# Patient Record
Sex: Male | Born: 1990 | Race: White | Hispanic: No | Marital: Single | Smoking: Never smoker
Health system: Southern US, Community
[De-identification: ages and names within clinical notes are randomized; demographics above are authoritative.]

## PROBLEM LIST (undated history)

## (undated) DIAGNOSIS — F1910 Other psychoactive substance abuse, uncomplicated: Secondary | ICD-10-CM

## (undated) DIAGNOSIS — Z8659 Personal history of other mental and behavioral disorders: Secondary | ICD-10-CM

## (undated) DIAGNOSIS — J45909 Unspecified asthma, uncomplicated: Secondary | ICD-10-CM

## (undated) DIAGNOSIS — S022XXA Fracture of nasal bones, initial encounter for closed fracture: Secondary | ICD-10-CM

## (undated) DIAGNOSIS — F1020 Alcohol dependence, uncomplicated: Secondary | ICD-10-CM

## (undated) HISTORY — DX: Fracture of nasal bones, initial encounter for closed fracture: S02.2XXA

## (undated) HISTORY — DX: Other psychoactive substance abuse, uncomplicated: F19.10

## (undated) HISTORY — PX: FRACTURE SURGERY: SHX138

## (undated) HISTORY — DX: Alcohol dependence, uncomplicated: F10.20

## (undated) HISTORY — DX: Personal history of other mental and behavioral disorders: Z86.59

## (undated) HISTORY — DX: Unspecified asthma, uncomplicated: J45.909

---
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
## 2011-09-07 ENCOUNTER — Other Ambulatory Visit: Payer: Self-pay | Admitting: Family Medicine

## 2011-09-07 DIAGNOSIS — R410 Disorientation, unspecified: Secondary | ICD-10-CM

## 2011-09-10 ENCOUNTER — Ambulatory Visit
Admission: RE | Admit: 2011-09-10 | Discharge: 2011-09-10 | Disposition: A | Discharge status: Home / Self Care | Payer: 59 | Source: Ambulatory Visit | Attending: Family Medicine | Admitting: Family Medicine

## 2011-09-10 DIAGNOSIS — R410 Disorientation, unspecified: Secondary | ICD-10-CM

## 2013-01-22 ENCOUNTER — Emergency Department (HOSPITAL_COMMUNITY): Payer: 59

## 2013-01-22 ENCOUNTER — Emergency Department (HOSPITAL_COMMUNITY)
Admission: EM | Admit: 2013-01-22 | Discharge: 2013-01-22 | Disposition: A | Discharge status: Home / Self Care | Payer: 59 | Attending: Emergency Medicine | Admitting: Emergency Medicine

## 2013-01-22 DIAGNOSIS — Z043 Encounter for examination and observation following other accident: Secondary | ICD-10-CM | POA: Insufficient documentation

## 2013-01-22 DIAGNOSIS — Y92410 Unspecified street and highway as the place of occurrence of the external cause: Secondary | ICD-10-CM | POA: Insufficient documentation

## 2013-01-22 DIAGNOSIS — Y9389 Activity, other specified: Secondary | ICD-10-CM | POA: Insufficient documentation

## 2013-01-22 DIAGNOSIS — F10929 Alcohol use, unspecified with intoxication, unspecified: Secondary | ICD-10-CM

## 2013-01-22 DIAGNOSIS — F1010 Alcohol abuse, uncomplicated: Secondary | ICD-10-CM | POA: Insufficient documentation

## 2013-01-22 LAB — URINALYSIS, ROUTINE W REFLEX MICROSCOPIC
Bilirubin Urine: NEGATIVE
Ketones, ur: NEGATIVE mg/dL
Nitrite: NEGATIVE
Protein, ur: NEGATIVE mg/dL
Specific Gravity, Urine: 1.02 (ref 1.005–1.030)
Urobilinogen, UA: 0.2 mg/dL (ref 0.0–1.0)

## 2013-01-22 LAB — RAPID URINE DRUG SCREEN, HOSP PERFORMED
Amphetamines: NOT DETECTED
Benzodiazepines: POSITIVE — AB
Opiates: NOT DETECTED

## 2013-01-22 LAB — COMPREHENSIVE METABOLIC PANEL
Albumin: 4.2 g/dL (ref 3.5–5.2)
BUN: 11 mg/dL (ref 6–23)
Creatinine, Ser: 0.79 mg/dL (ref 0.50–1.35)
GFR calc Af Amer: >90 mL/min (ref >90)
Total Protein: 6.9 g/dL (ref 6.0–8.3)

## 2013-01-22 LAB — CBC WITH DIFFERENTIAL/PLATELET
Basophils Absolute: 0 10*3/uL (ref 0.0–0.1)
Basophils Relative: 0 % (ref 0–1)
Eosinophils Absolute: 0.1 10*3/uL (ref 0.0–0.7)
Eosinophils Relative: 1 % (ref 0–5)
HCT: 43.4 % (ref 39.0–52.0)
MCHC: 36.2 g/dL — ABNORMAL HIGH (ref 30.0–36.0)
Monocytes Absolute: 0.5 10*3/uL (ref 0.1–1.0)
Neutro Abs: 3.7 10*3/uL (ref 1.7–7.7)
RDW: 12.4 % (ref 11.5–15.5)

## 2013-01-22 LAB — ETHANOL: Alcohol, Ethyl (B): 216 mg/dL — ABNORMAL HIGH (ref 0–11)

## 2013-01-22 MED ORDER — SODIUM CHLORIDE 0.9 % IV BOLUS (SEPSIS)
1000.0000 mL | Freq: Once | INTRAVENOUS | Status: AC
  Administered 2013-01-22: 1000 mL via INTRAVENOUS

## 2013-01-22 MED ORDER — IOHEXOL 300 MG/ML  SOLN
100.0000 mL | Freq: Once | INTRAMUSCULAR | Status: AC | PRN
  Administered 2013-01-22: 100 mL via INTRAVENOUS
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
## 2013-01-22 NOTE — ED Notes (Signed)
Patient vomited brown liquid all over himself and side of bed and floor
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
## 2013-01-22 NOTE — ED Provider Notes (Signed)
The patient is awake and alert now. His parents are here and are ready to take the patient home.  Patient mentioned having some mild left shoulder pain. He is able to lift off the bed but does have discomfort in the acromial clavicular region.  I ordered an x-ray of the patient's left shoulder but he decided that he did not want to wait any longer. Patient states pain is not that bad and he will followup with his doctor tomorrow.  Celene Kras, MD 01/22/13 [phone]

## 2013-01-22 NOTE — ED Notes (Signed)
Pt. Decided not to have shoulder x-ray done.  Parents also agreed to that decisionl

## 2013-03-15 ENCOUNTER — Ambulatory Visit (HOSPITAL_COMMUNITY)
Admission: RE | Admit: 2013-03-15 | Discharge: 2013-03-15 | Disposition: A | Discharge status: Home / Self Care | Payer: 59 | Source: Ambulatory Visit | Attending: Family Medicine | Admitting: Family Medicine

## 2013-03-15 ENCOUNTER — Other Ambulatory Visit (HOSPITAL_COMMUNITY): Payer: Self-pay | Admitting: Family Medicine

## 2013-03-15 DIAGNOSIS — W268XXA Contact with other sharp object(s), not elsewhere classified, initial encounter: Secondary | ICD-10-CM | POA: Insufficient documentation

## 2013-03-15 DIAGNOSIS — M79609 Pain in unspecified limb: Secondary | ICD-10-CM | POA: Insufficient documentation

## 2013-04-19 ENCOUNTER — Telehealth: Payer: Self-pay

## 2013-04-19 NOTE — Telephone Encounter (Signed)
Pt not in Epic this is work comp message will be sent in [place].

## 2013-04-19 NOTE — Telephone Encounter (Signed)
Pt wants elizabeth to call him wouldn't say why.    bf
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
## 2014-12-12 IMAGING — CR DG FOOT COMPLETE 3+V*R*
3 series · 3 of 3 positions shown · non-contrast
Comparison: None.

CLINICAL DATA: Right plantar surface foot pain inferior to the
great toe after stepping on a nail this morning.

EXAM:
RIGHT FOOT COMPLETE - 3+ VIEW

[t foot ap right]
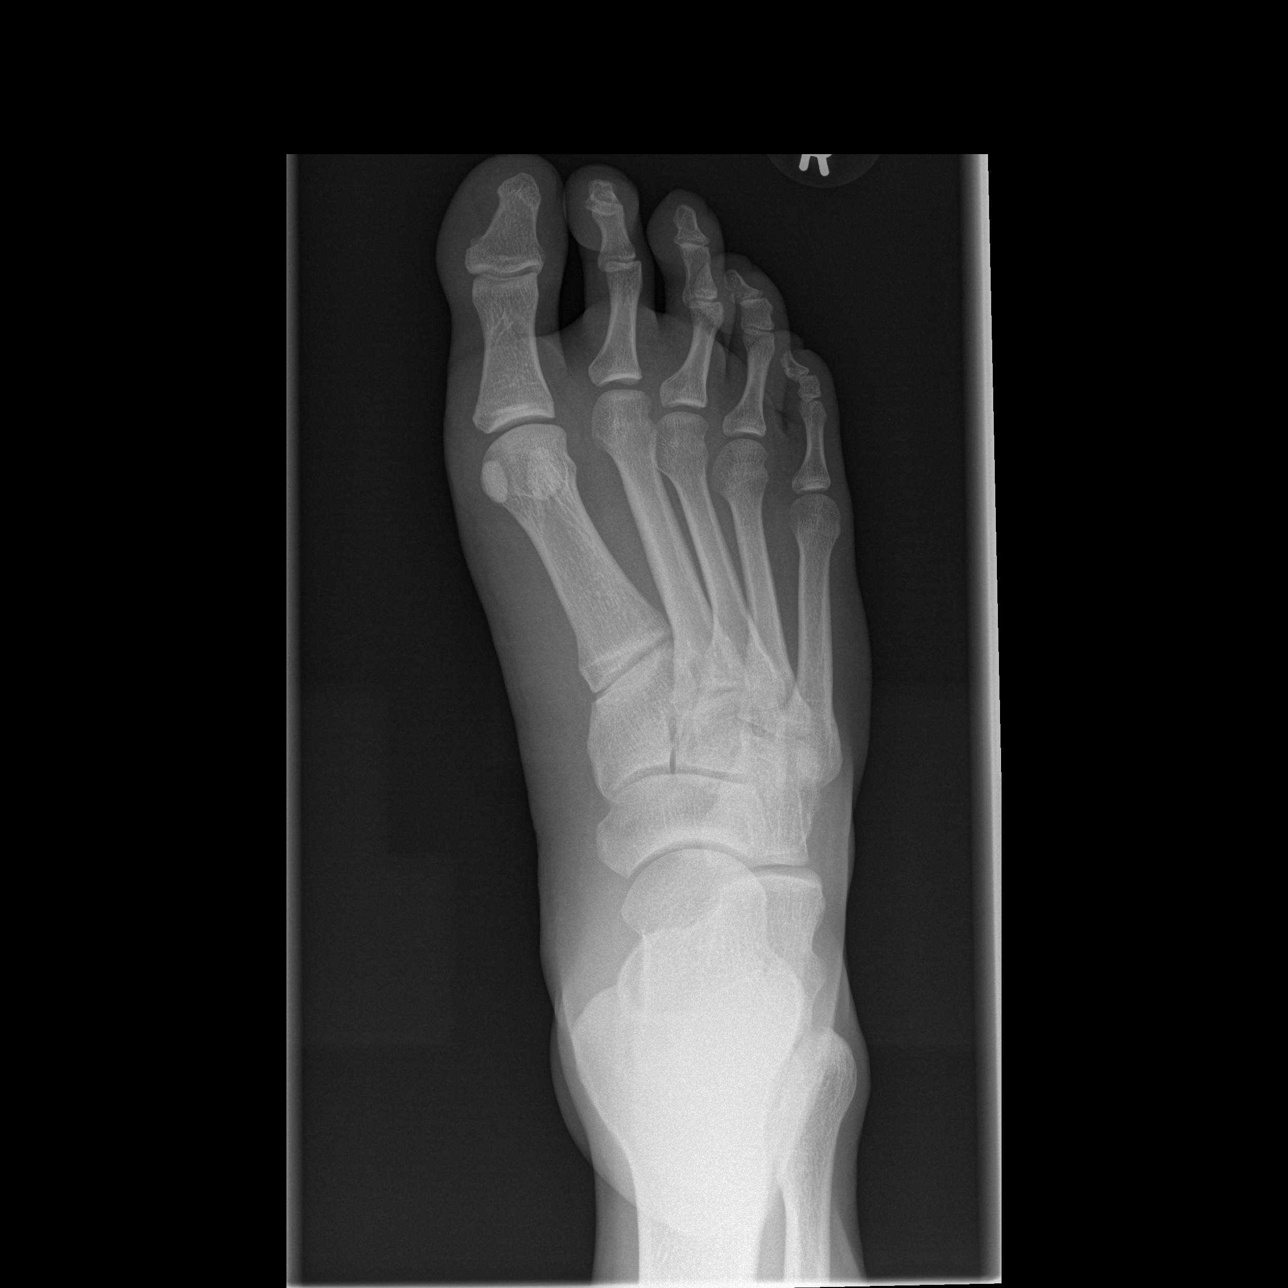

[t foot oblique right]
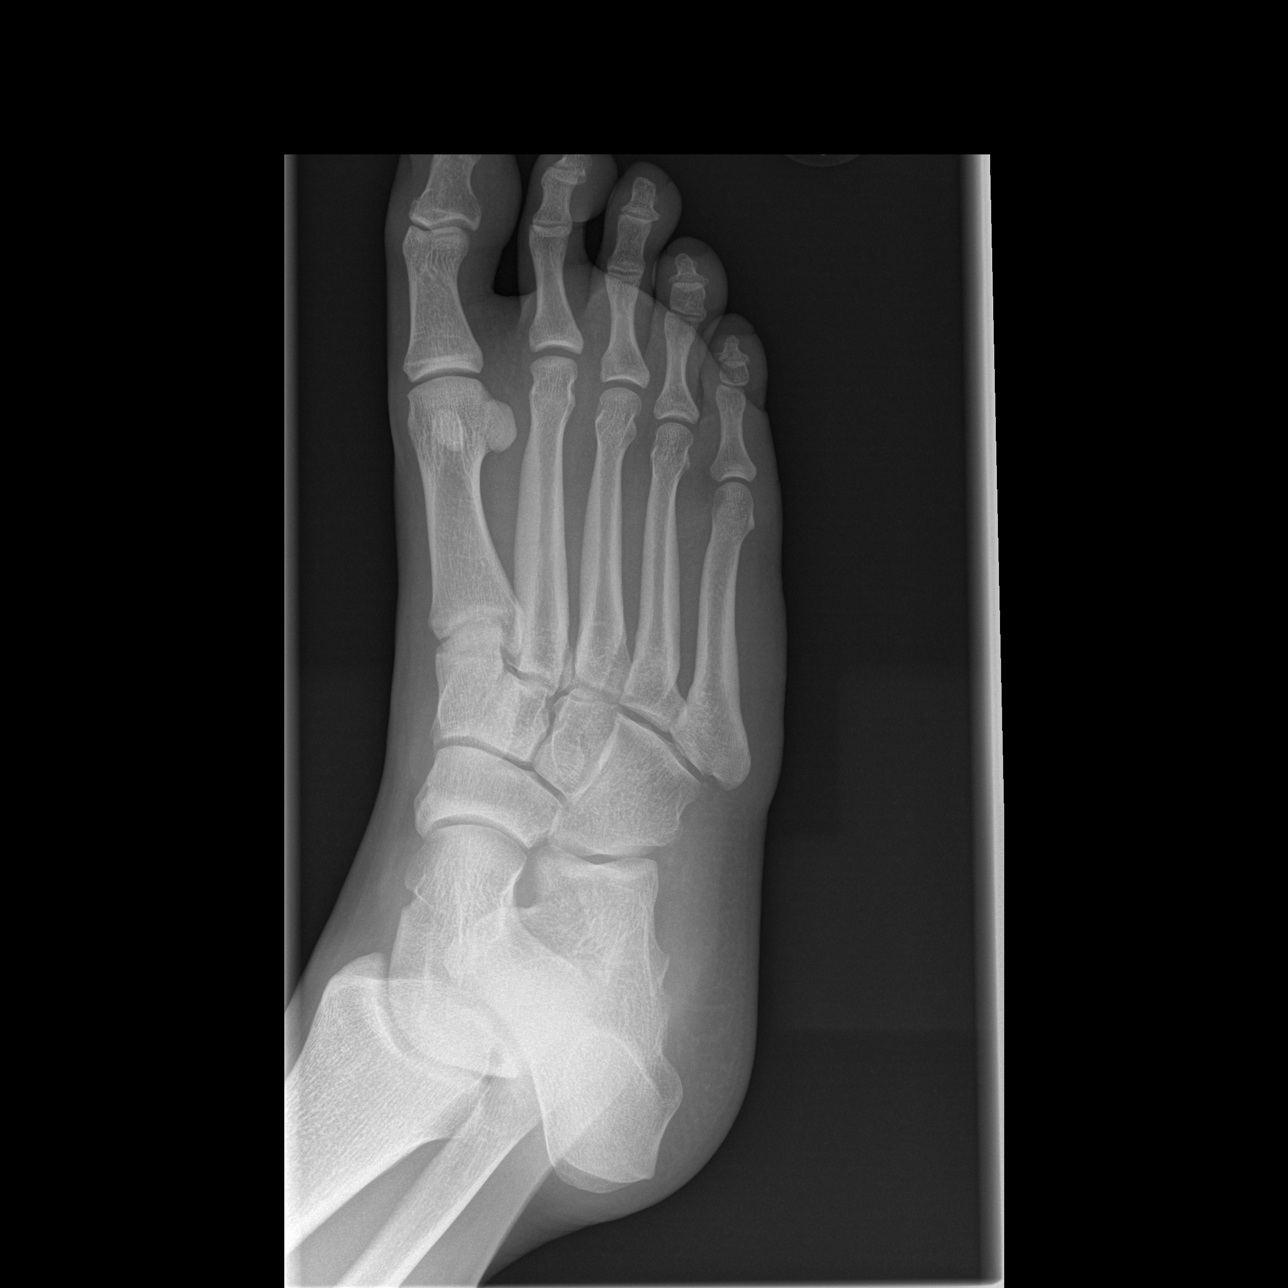

[t foot lat right]
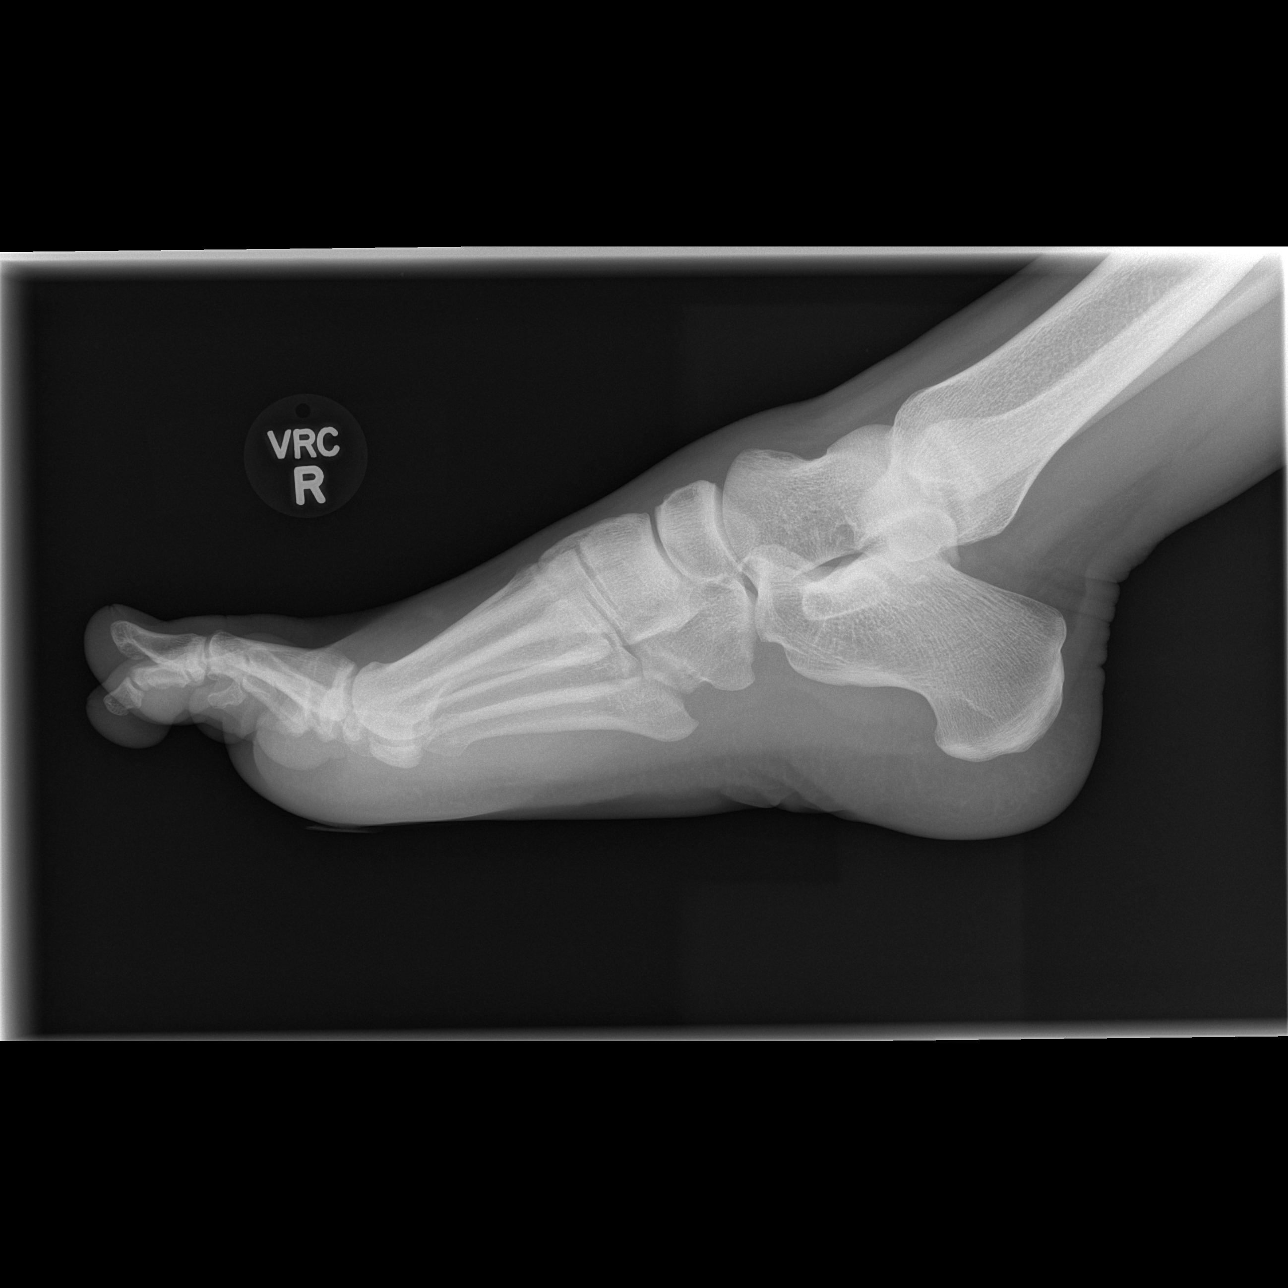

[3 of 3 positions shown; findings below may reference images not displayed]

FINDINGS: Normal appearing bones and soft tissues without fracture,
dislocation or radiopaque foreign body.
IMPRESSION: No fracture or radiopaque foreign body.

## 2015-07-09 DIAGNOSIS — IMO0001 Reserved for inherently not codable concepts without codable children: Secondary | ICD-10-CM

## 2015-07-09 HISTORY — DX: Reserved for inherently not codable concepts without codable children: IMO0001

## 2015-10-29 ENCOUNTER — Ambulatory Visit (INDEPENDENT_AMBULATORY_CARE_PROVIDER_SITE_OTHER): Payer: 59 | Admitting: Family Medicine

## 2015-10-29 VITALS — BP 118/70 | HR 49 | Temp 98.3°F | Resp 18 | Ht 72.75 in | Wt 195.2 lb

## 2015-10-29 DIAGNOSIS — F1021 Alcohol dependence, in remission: Secondary | ICD-10-CM | POA: Diagnosis not present

## 2015-10-29 DIAGNOSIS — Z0000 Encounter for general adult medical examination without abnormal findings: Secondary | ICD-10-CM

## 2015-10-29 DIAGNOSIS — Z111 Encounter for screening for respiratory tuberculosis: Secondary | ICD-10-CM

## 2015-10-29 DIAGNOSIS — F1291 Cannabis use, unspecified, in remission: Secondary | ICD-10-CM

## 2015-10-29 DIAGNOSIS — Z029 Encounter for administrative examinations, unspecified: Secondary | ICD-10-CM

## 2015-10-29 DIAGNOSIS — Z87898 Personal history of other specified conditions: Secondary | ICD-10-CM

## 2015-10-29 NOTE — Progress Notes (Signed)

## 2015-10-29 NOTE — Patient Instructions (Addendum)
1.  Return in 48-72 hours for Tb skin test read.  Please bring your form to follow-up visit for completion.     IF you received an x-ray today, you will receive an [facility] from [facility]. Please contact [facility] at [phone] with questions or concerns regarding your invoice.   IF you received labwork today, you will receive an invoice from [facility]. Please contact [facility] at [phone] with questions or concerns regarding your invoice.   Our billing staff will not be able to assist you with questions regarding bills from these companies.  You will be contacted with the lab results as soon as they are available. The fastest way to get your results is to activate your My Chart account. Instructions are located on the last page of this paperwork. If you have not heard from us regarding the results in 2 weeks, please contact this office.    Tuberculin Skin Test WHY AM I HAVING THIS TEST? Tuberculosis (TB) is a bacterial infection caused by Mycobacterium tuberculosis. Most people who are exposed to these bacteria have a strong enough defense (immune) system to prevent the bacteria from causing TB and developing symptoms. Their bodies prevent the germs from being active and making them sick (latent TB infection).  However, if you have TB germs in your body and your immune system is weak, you can develop a TB infection. This can cause symptoms such as:   Night sweats.  Fever.  Weakness.  Weight loss. A latent TB infection can also become active later in life if your immune system becomes weakened or compromised. You may have this test if your health care provider suspects that you have TB. You may also have this test to screen for TB if you are at risk for getting the disease. Those at increased risk include:  People who inject illegal drugs or share needles.  People with HIV or other diseases that affect immunity.  Health care  workers.  People who live in high-risk communities, such as homeless shelters, nursing homes, and correctional facilities.  People who have been in contact with someone with TB.  People from countries where TB is more common. If you are in a high-risk group, your health care provider may wish to screen for TB more often. This can help prevent the spread of the disease. Sometimes TB screening is required when starting a new job, such as becoming a Scientist, forensichealth care worker or a Runner, broadcasting/film/videoteacher. Colleges or universities may require it of new students. HOW WILL I BE TESTED? A tuberculin skin test is the main test used to check for exposure to the bacteria that can cause TB. The test checks for antibodies to the bacteria. Antibodies are proteins that your body produces to protect you from germs and other things that can make you sick. Your health care provider will inject a solution known as PPD (purified protein derivative) under the first layer of skin on your arm. This causes a blister-like bubble to form at the site. Your health care provider will then examine the site after a number of hours have passed to see if a reaction has occurred. HOW DO I PREPARE FOR THE TEST? There is no preparation required for this test. WHAT DO THE RESULTS MEAN? Your test results will be reported as either negative or positive.  If the tuberculin skin test produces a negative result, it is likely that you do not have TB and have not been exposed to the TB bacteria.  If you or your health care provider suspects exposure, however, you may want to repeat the test a few weeks later. A blood test may also be used to check for TB. This is because you will not react to the tuberculin skin test until several weeks after exposure to TB bacteria. If you test positive to the tuberculin skin test, it is likely that you have been exposed to TB bacteria. The test does not distinguish between an active and a latent TB infection. A false-positive  result can occur. A false-positive result for TB bacteria is incorrect because it indicates a condition or finding is present when it is not. Talk to your health care provider to discuss your results, treatment options, and if necessary, the need for more tests. It is your responsibility to obtain your test results. Ask the lab or department performing the test when and how you will get your results. Talk with your health care provider if you have any questions about your results.   This information is not intended to replace advice given to you by your health care provider. Make sure you discuss any questions you have with your health care provider.   Document Released: 03/02/2005 Document Revised: 06/13/2014 Document Reviewed: 09/16/2013 Elsevier Interactive Patient Education [employer]

## 2015-10-29 NOTE — Progress Notes (Signed)
Subjective:    Patient ID: Mason Sanchez, male    DOB: May 12, 1991, 25 y.o.   MRN: [id number]  10/29/2015  Annual Exam   HPI This 25 y.o. male presents for Complete Physical Examination.  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    Last physical:not sure TDAP:  Not sure Influenza:  no Eye exam:  Not sure Dental exam:  Yearly.  Alcohol and marijuana abuse: maintained on Naltrexone since 07/2015.  Mostly marijuana use.  Xanax occasionally.  Recovery.  Involved in church.  [place].  Psychiatry in town.  Also seeing counselor.  Just received Tb skin test in February 2017.    Review of Systems  Constitutional: Negative for fever, chills, diaphoresis, activity change, appetite change and fatigue.  Respiratory: Negative for cough and shortness of breath.   Cardiovascular: Negative for chest pain, palpitations and leg swelling.  Gastrointestinal: Negative for nausea, vomiting, abdominal pain and diarrhea.  Endocrine: Negative for cold intolerance, heat intolerance, polydipsia, polyphagia and polyuria.  Skin: Negative for color change, rash and wound.  Neurological: Negative for dizziness, tremors, seizures, syncope, facial asymmetry, speech difficulty, weakness, light-headedness, numbness and headaches.  Psychiatric/Behavioral: Negative for sleep disturbance and dysphoric mood. The patient is not nervous/anxious.     Past Medical History  Diagnosis Date  . Asthma     childhood asthma; no hospitalizations  . Alcoholism /alcohol abuse (HCC) 07/09/2015    [facility] 07/2015 for alcoholism and marijuana by report; discharged on Naltrexone?  . Polysubstance abuse     [facility] 07/2015   Past Surgical History  Procedure Laterality Date  . Fracture surgery      nasal fracture   No Known Allergies Current Outpatient Prescriptions  Medication Sig Dispense Refill  . naltrexone (DEPADE) 50 MG tablet Take 50 mg by mouth daily.     No  current facility-administered medications for this visit.   Social History   Social History  . Marital Status: Single    Spouse Name: N/A  . Number of Children: N/A  . Years of Education: N/A   Occupational History  . Not on file.   Social History Main Topics  . Smoking status: Never Smoker   . Smokeless tobacco: Never Used  . Alcohol Use: No     Comment: histoyr of alcoholism; [facility] 07/2015  . Drug Use: No     Comment: history of marijuana use  . Sexual Activity: Not on file   Other Topics Concern  . Not on file   Social History Narrative   Marital status: single      Children:  None      Lives: with parents      Employment: residential counselor at [employer]      Tobacco: none      Alcohol: none in 10/2015; [facility] 07[facility] for alcoholism      Drugs: none in 10/2015; [facility] in 07/2015 for marijuana? Per patient; discharged on Naltrexone?      Exercise: sporadically; 2-3 days per week      Seatbelt: 100%; no texting         Family History  Problem Relation Age of Onset  . Diabetes Maternal Grandmother   . Hypertension Maternal Grandfather   . Heart disease Paternal Grandfather        Objective:    BP 118/70 mmHg  Pulse 49  Temp(Src) 98.3 F (36.8 C) (Oral)  Resp 18  Ht 6' 0.75" (1.848 m)  Wt 195 lb 3.2 oz (88.542 kg)  BMI 25.93 kg/m2  SpO2 98% Physical Exam  Constitutional: He is oriented to person, place, and time. He appears well-developed and well-nourished. No distress.  HENT:  Head: Normocephalic and atraumatic.  Right Ear: External ear normal.  Left Ear: External ear normal.  Nose: Nose normal.  Mouth/Throat: Oropharynx is clear and moist.  Eyes: Conjunctivae and EOM are normal. Pupils are equal, round, and reactive to light.  Neck: Normal range of motion. Neck supple. Carotid bruit is not present. No thyromegaly present.  Cardiovascular: Normal rate, regular rhythm, normal heart sounds and intact distal pulses.  Exam  reveals no gallop and no friction rub.   No murmur heard. Pulmonary/Chest: Effort normal and breath sounds normal. He has no wheezes. He has no rales.  Abdominal: Soft. Bowel sounds are normal. He exhibits no distension and no mass. There is no tenderness. There is no rebound and no guarding. Hernia confirmed negative in the right inguinal area and confirmed negative in the left inguinal area.  Genitourinary: Testes normal and penis normal.  Lymphadenopathy:    He has no cervical adenopathy.  Neurological: He is alert and oriented to person, place, and time. No cranial nerve deficit.  Skin: Skin is warm and dry. No rash noted. He is not diaphoretic.  Psychiatric: He has a normal mood and affect. His behavior is normal.  Nursing note and vitals reviewed.  Results for orders placed or performed in visit on 10/29/15  TB Skin Test  Result Value Ref Range   TB Skin Test Negative    Induration 0.6100mm mm       Assessment & Plan:   1. Administrative encounter   2. History of marijuana use   3. Screening for tuberculosis   4. History of alcoholism (HCC)    -clearance for employment yet reported history of [facility] admission for polysubstance abuse. -tb skin test placement. -question reported substances of abuse with active naltrexone rx.   Orders Placed This Encounter  Procedures  . TB Skin Test    Order Specific Question:  Has patient ever tested positive?    Answer:  No   Meds ordered this encounter  Medications  . naltrexone (DEPADE) 50 MG tablet    Sig: Take 50 mg by mouth daily.    No Follow-up on file.    Laurelai Lepp Paulita FujitaMartin Leta Bucklin, M.D. [facility]  [place] [address] [place], [place]  [id number] [phone] phone [phone] fax

## 2015-10-31 ENCOUNTER — Ambulatory Visit (INDEPENDENT_AMBULATORY_CARE_PROVIDER_SITE_OTHER): Payer: 59

## 2015-10-31 DIAGNOSIS — Z111 Encounter for screening for respiratory tuberculosis: Secondary | ICD-10-CM

## 2015-10-31 LAB — TB SKIN TEST: TB SKIN TEST: NEGATIVE

## 2015-10-31 NOTE — Progress Notes (Signed)
Pt. was here for a PPD read. Results negative. Induration 0.2300mm. Patient was given a copy of his results. He stated that he will return on Tuesday to have the actual TB portion of his paperwork filled out since he did not have the forms with him today.

## 2015-12-04 ENCOUNTER — Encounter: Payer: Self-pay | Admitting: Family Medicine

## 2015-12-04 DIAGNOSIS — F1291 Cannabis use, unspecified, in remission: Secondary | ICD-10-CM | POA: Insufficient documentation

## 2015-12-04 DIAGNOSIS — Z87898 Personal history of other specified conditions: Secondary | ICD-10-CM | POA: Insufficient documentation

## 2015-12-04 DIAGNOSIS — F1021 Alcohol dependence, in remission: Secondary | ICD-10-CM | POA: Insufficient documentation

## 2017-10-20 ENCOUNTER — Encounter: Payer: Self-pay | Admitting: Family Medicine

## 2017-10-25 ENCOUNTER — Encounter: Payer: Self-pay | Admitting: Family Medicine

## 2019-06-05 ENCOUNTER — Other Ambulatory Visit: Payer: Self-pay

## 2019-06-05 ENCOUNTER — Encounter: Payer: Self-pay | Admitting: Psychiatry

## 2019-06-05 ENCOUNTER — Ambulatory Visit (INDEPENDENT_AMBULATORY_CARE_PROVIDER_SITE_OTHER): Payer: BC Managed Care – PPO | Admitting: Psychiatry

## 2019-06-05 ENCOUNTER — Encounter (INDEPENDENT_AMBULATORY_CARE_PROVIDER_SITE_OTHER): Payer: Self-pay

## 2019-06-05 DIAGNOSIS — F22 Delusional disorders: Secondary | ICD-10-CM | POA: Diagnosis not present

## 2019-06-05 MED ORDER — LURASIDONE HCL 40 MG PO TABS: 20.0000 mg | ORAL_TABLET | Freq: Every day | ORAL | 0 refills | Status: DC
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
## 2019-06-12 ENCOUNTER — Ambulatory Visit: Payer: BC Managed Care – PPO | Admitting: Psychiatry

## 2020-03-25 ENCOUNTER — Encounter: Payer: Self-pay | Admitting: Psychiatry

## 2022-11-24 ENCOUNTER — Ambulatory Visit (HOSPITAL_COMMUNITY)
Admission: EM | Admit: 2022-11-24 | Discharge: 2022-11-24 | Disposition: A | Discharge status: Home / Self Care | Payer: No Payment, Other | Attending: Nurse Practitioner | Admitting: Nurse Practitioner

## 2022-11-24 DIAGNOSIS — F22 Delusional disorders: Secondary | ICD-10-CM | POA: Insufficient documentation

## 2022-11-24 DIAGNOSIS — F17210 Nicotine dependence, cigarettes, uncomplicated: Secondary | ICD-10-CM | POA: Insufficient documentation

## 2022-11-24 DIAGNOSIS — Z008 Encounter for other general examination: Secondary | ICD-10-CM

## 2022-11-24 DIAGNOSIS — F1911 Other psychoactive substance abuse, in remission: Secondary | ICD-10-CM | POA: Insufficient documentation

## 2022-11-24 NOTE — Discharge Instructions (Addendum)
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
## 2022-12-04 ENCOUNTER — Other Ambulatory Visit: Payer: Self-pay

## 2022-12-04 ENCOUNTER — Emergency Department (HOSPITAL_COMMUNITY)
Admission: EM | Admit: 2022-12-04 | Discharge: 2022-12-06 | Disposition: A | Discharge status: Other Acute Inpatient Hospital | Payer: Medicaid Other | Attending: Emergency Medicine | Admitting: Emergency Medicine

## 2022-12-04 ENCOUNTER — Encounter (HOSPITAL_COMMUNITY): Payer: Self-pay

## 2022-12-04 DIAGNOSIS — F259 Schizoaffective disorder, unspecified: Secondary | ICD-10-CM | POA: Diagnosis present

## 2022-12-04 DIAGNOSIS — F29 Unspecified psychosis not due to a substance or known physiological condition: Secondary | ICD-10-CM | POA: Insufficient documentation

## 2022-12-04 DIAGNOSIS — F22 Delusional disorders: Secondary | ICD-10-CM | POA: Diagnosis present

## 2022-12-04 LAB — CBC WITH DIFFERENTIAL/PLATELET
Abs Immature Granulocytes: 0.03 10*3/uL (ref 0.00–0.07)
Basophils Absolute: 0 10*3/uL (ref 0.0–0.1)
Basophils Relative: 0 %
Eosinophils Absolute: 0 10*3/uL (ref 0.0–0.5)
Eosinophils Relative: 1 %
HCT: 44.9 % (ref 39.0–52.0)
Hemoglobin: 15.8 g/dL (ref 13.0–17.0)
Immature Granulocytes: 1 %
Lymphocytes Relative: 20 %
Lymphs Abs: 1.3 10*3/uL (ref 0.7–4.0)
MCH: 32 pg (ref 26.0–34.0)
MCHC: 35.2 g/dL (ref 30.0–36.0)
MCV: 90.9 fL (ref 80.0–100.0)
Monocytes Absolute: 0.5 10*3/uL (ref 0.1–1.0)
Monocytes Relative: 9 %
Neutro Abs: 4.4 10*3/uL (ref 1.7–7.7)
Neutrophils Relative %: 69 %
Platelets: 217 10*3/uL (ref 150–400)
RBC: 4.94 MIL/uL (ref 4.22–5.81)
RDW: 12.4 % (ref 11.5–15.5)
WBC: 6.3 10*3/uL (ref 4.0–10.5)
nRBC: 0 % (ref 0.0–0.2)

## 2022-12-04 LAB — COMPREHENSIVE METABOLIC PANEL
ALT: 28 U/L (ref 0–44)
AST: 24 U/L (ref 15–41)
Albumin: 4.1 g/dL (ref 3.5–5.0)
Alkaline Phosphatase: 62 U/L (ref 38–126)
Anion gap: 8 (ref 5–15)
BUN: 11 mg/dL (ref 6–20)
CO2: 22 mmol/L (ref 22–32)
Calcium: 8.9 mg/dL (ref 8.9–10.3)
Chloride: 107 mmol/L (ref 98–111)
Creatinine, Ser: 0.8 mg/dL (ref 0.61–1.24)
GFR, Estimated: >60 mL/min (ref >60)
Glucose, Bld: 112 mg/dL — ABNORMAL HIGH (ref 70–99)
Potassium: 3.5 mmol/L (ref 3.5–5.1)
Sodium: 137 mmol/L (ref 135–145)
Total Bilirubin: 0.5 mg/dL (ref 0.3–1.2)
Total Protein: 6.7 g/dL (ref 6.5–8.1)

## 2022-12-04 LAB — RAPID URINE DRUG SCREEN, HOSP PERFORMED
Amphetamines: NOT DETECTED
Barbiturates: NOT DETECTED
Benzodiazepines: NOT DETECTED
Cocaine: NOT DETECTED
Opiates: NOT DETECTED
Tetrahydrocannabinol: NOT DETECTED

## 2022-12-04 LAB — ETHANOL: Alcohol, Ethyl (B): <10 mg/dL (ref <10)

## 2022-12-04 MED ORDER — ZIPRASIDONE MESYLATE 20 MG IM SOLR: 20.0000 mg | INTRAMUSCULAR | Status: DC | PRN

## 2022-12-04 MED ORDER — LURASIDONE HCL 20 MG PO TABS
20.0000 mg | ORAL_TABLET | Freq: Every day | ORAL | Status: DC
  Filled 2022-12-04 (×3): qty 1

## 2022-12-04 MED ORDER — OLANZAPINE 5 MG PO TBDP: 5.0000 mg | ORAL_TABLET | Freq: Three times a day (TID) | ORAL | Status: DC | PRN

## 2022-12-04 MED ORDER — LORAZEPAM 1 MG PO TABS: 1.0000 mg | ORAL_TABLET | ORAL | Status: DC | PRN
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
## 2022-12-04 NOTE — ED Notes (Signed)
Pt's father has all belongings

## 2022-12-04 NOTE — ED Notes (Signed)
Pt signed HIPAA waiver to allow his father, Donterio Pappalardo., to allow access to medical information during this encounter.

## 2022-12-04 NOTE — ED Notes (Signed)
Pt talking with psychiatry at this time.  

## 2022-12-04 NOTE — ED Notes (Signed)
Pt hyper focused on having brand new scrubs that have never been worn, new bed linens, and seeing that items come out of the package new. Pt also exhibiting religiosity stating that he is going through a spiritual battle.

## 2022-12-04 NOTE — ED Notes (Signed)
Pt refused EKG at this time. States,"I do not want any electrical impulses, it will stress me out." Explained the purpose of the EKG, but still did not want it.

## 2022-12-04 NOTE — ED Triage Notes (Addendum)
Pt arrived via POV with father. Per father pt has been hallucinating, hearing and seeing things that are not there. Pt has been yelling at family and neighbors. Pt called this Clinical research associate a "bad apple" and asked another staff member "who's coming to hell with me today"

## 2022-12-04 NOTE — ED Notes (Signed)
Pt very guarded and adamantly denies any need to be here, states he is unsure why he's here "my dad and I just had like a 3 hour conversation and they were hiding how my brother came into contact with my therapist, so we were arguing and my dad couldn't handle it and just brought me here. I'm fine, I have nothing wrong with me medically or psychiatrically." Attempt made to provide verbal redirection for reason for stay unsuccessful, pt became accusatory and requested this nurse to "quit projecting on me" "I don't have problems, I'm fine, and I don't need you to continue projecting your issues on me." While this nurse attempting to establish trust, also understanding of why pt adamantly refusing to take prescribed latuda. Attempt made to discover what alternatives help with pt at home, pt reports he meditates daily, he doesn't have any psychiatric problems so he doesn't need to see anyone or talk to anyone about his issues. Nurse attempted to provide verbal reassurance that accepting help is okay at critical times. Pt became so upset with the conversation that he then got out of his bed, walked out of the room and into the bathroom. Upon attempt to obtain temperature orally pt enters his room stating to this nurse "I am not required to have any conversations with you, you're not my care team, you're just a nurse who is here to get my vital signs and I don't have to talk to you" when tech present to doorway of nurses station pt promptly states "see she's here, and she knows what's up, you don't need to talk to me, you're just a nurse!" Attempts made to assist verbal de-escalation unsuccessful at this time. Pt up back and forth to rest room, continues wringing his hands intermittently while up in room or up to restroom, despite pt adamantly denies any anxiety. Pt smiling continuously throughout discussion up to this point with RN despite exhibiting restless behavior where he has made and remade his bed in room approx.  3 times in the past 90 minutes. Pt has made several trips to and from the restroom since arrival tonight, including additional trip to take a shower. Pt pacing in his room several times wringing hands. Pt has now made several attempts to talk to only the MHT and refuses to acknowledge nurse any longer. MHT reports that pt made attempts to talk about the nurse to her and complaints voiced that "I'm not anxious or angry, that nurse is just projecting what she believes onto me so that I will think I'm feeling that way." Pt provided down time and space to himself to attempt to de-escalate behaviors concerning for increased agitation/anxiety.

## 2022-12-04 NOTE — Consult Note (Cosign Needed Addendum)
[facility] ED ASSESSMENT   Reason for Consult: delusional, aggressive behavior Referring Physician:  Dr. Fredderick Phenix Patient Identification: Mason Sanchez MRN:  [id number] ED Chief Complaint: Schizoaffective disorder [facility])  Diagnosis:  Principal Problem:   Schizoaffective disorder [facility])   ED Assessment Time Calculation: Start Time: 1500 Stop Time: 1540 Total Time in Minutes (Assessment Completion): 40   Subjective: Mason Sanchez is a 32 y.o. male.  With past psychiatric history of polysubstance abuse, alcoholism, history of marijuana abuse, and delusional disorder persecutory type, who presented IVC to [facility] accompanied by his father Mason Sanchez [phone] with request for psychological evaluation due to behavior concerns/bizarre behavior, and aggression.    HPI: Mason Sanchez, 32 y.o., male patient seen face to face by this provider, consulted with Dr. Jannifer Franklin; and chart reviewed on 12/04/22.  On evaluation Mason Sanchez evaluated separately from father, when provider asked him why he was here in the hospital, patient stated "why do you think I am here in the hospital "provider asked if he was having hallucinations, he states if that is what he says, I guess that is what it is.  Patient is very evasive, but not answering questions.  Patient then states as a delayed response "no, I am not having hallucinations, my father just brought me here."Patient states he does not know why he is here in the hospital, denies hallucination, denies SI/HI/AVH.  Patient denies ever being admitted to an inpatient psychiatric facility, says that he does not take any psychiatric medications.  Provider asked if he was having any issues with his mother, he stated "she treats me like a stepchild, and I am her real child, if she came to this hospital I will just ignore her and not let her get any upset."Provider asked if he and mother ever had a close relationship, where they were not upset with each other and happy, patient states "yes she  took me to [facility] about 8 years ago, admitted to a girlfriend and she said that is a weird romantic place for your mother to take you."  Provider asked patient that he ever that his mother was trying to be romantic with him and he denied it.  Patient conversation is very bizarre, his father states that patient is blaming his mother for things that are happening to him and feels that his mother is poisoning him.  Patient then begins to laugh bizarrely and inappropriately. Patient denies using any illicit drugs or alcohol, patient UDS negative and BAL less than 10.  Patient's father Mason Sanchez, states that patient feels his mother is controlling him and the entire family, patient feels that his mother is trying to poison him and ruin his life.  Mr. Mason Sanchez states that this is the fourth bizarre episode that patient has had in the 11 years.  States patient has never been admitted to a psychiatric facility, and would not take medications because he feels like he does not need them.  He states that patient went to [facility] in outpatient psychiatric facility in [facility], from May to July, due to same behavior.  Patient last bizarre episode was from November to January 2020.  Mr. Mason Sanchez states that after a couple months patient becomes lucid and coherent as if nothing has ever happened.  He states that as the patient's last episode in 2020, patient mood was better, he became clearer, he was able to move to another city and live a productive life, had no delusions or hallucinations until now.  He states  that patient has never shown violent tendencies, where he was aggressive and hitting people.  He states patient will yell and scream, but would not be physically aggressive towards anyone.  He states patient was also seeing a therapist here in [facility], but only lasted a few sessions.  On evaluation, patient is alert, oriented x 3, and cooperative. Speech is clear, normal rate and coherent. Pt  appears casual. Eye contact is good. Mood is anxious, affect is congruent/ bizarre with mood. Thought process is tangential and thought content is scattered. Pt denies SI/HI/AVH. There is no objective indication that the patient is responding to internal stimuli. No delusions elicited during this assessment.   Past Psychiatric History: With past psychiatric history of polysubstance abuse, alcoholism, history of marijuana abuse, and delusional disorder persecutory type  Risk to Self or Others: Risk to Self:  Yes, currently at risk to self only do to inability to sleep and active psychosis (mania and delusional thinking) however the patient is not endorsing any self-harm, suicidal ideation and has not had any active suicidality.  Risk to Others:  Yes  Prior Inpatient Therapy:  No  Prior Outpatient Therapy: Yes    [place] Scale:  Flowsheet Row ED from 12/04/2022 in [facility] Emergency Department at [facility] ED from 11/24/2022 in [facility]  C-SSRS RISK CATEGORY No Risk No Risk       AIMS:  , , ,  ,   ASAM:    Substance Abuse:     Past Medical History:  Past Medical History:  Diagnosis Date   Alcoholism /alcohol abuse 07/09/2015   [facility] admission 07/2015 for alcoholism and marijuana by report; discharged on Naltrexone?   Asthma    childhood asthma; no hospitalizations   Nasal fracture    Polysubstance abuse [facility])    [facility] admission 07/2015    Past Surgical History:  Procedure Laterality Date   FRACTURE SURGERY     nasal fracture   Family History:  Family History  Problem Relation Age of Onset   Hypertension Father    Diabetes Maternal Grandmother    Hypertension Maternal Grandfather    Heart disease Paternal Grandfather    Drug abuse Paternal Grandfather    Alcohol abuse Maternal Uncle    Alcohol abuse Cousin    Family Psychiatric  History: None per patient father Social History:  Social History   Substance  and Sexual Activity  Alcohol Use Not Currently   Alcohol/week: 0.0 standard drinks of alcohol   Comment: histoyr of alcoholism; [facility] 07/2015     Social History   Substance and Sexual Activity  Drug Use Not Currently   Types: Marijuana, Benzodiazepines   Comment: history of marijuana use    Social History   Socioeconomic History   Marital status: Single    Spouse name: Not on file   Number of children: Not on file   Years of education: Not on file   Highest education level: Associate degree: occupational, Scientist, product/process development, or vocational program  Occupational History   Occupation: Advertising copywriter  Tobacco Use   Smoking status: Never   Smokeless tobacco: Never  Vaping Use   Vaping Use: Never used  Substance and Sexual Activity   Alcohol use: Not Currently    Alcohol/week: 0.0 standard drinks of alcohol    Comment: histoyr of alcoholism; [facility] 07/2015   Drug use: Not Currently    Types: Marijuana, Benzodiazepines    Comment: history of marijuana  use   Sexual activity: Not on file  Other Topics Concern   Not on file  Social History Narrative   Marital status: single      Children:  None      Lives: with parents      Employment: residential counselor at [employer]      Tobacco: none      Alcohol: none in 10/2015; [facility] 07/2015 for alcoholism      Drugs: none in 10/2015; [facility] in 07/2015 for marijuana? Per patient; discharged on Naltrexone?      Exercise: sporadically; 2-3 days per week      Seatbelt: 100%; no texting      06/05/2019 patient has apparently completed [facility] after attending 1-1/2 years to [employer] after [employer].  Cannabis more than alcohol than Xanax have been substances of abuse in the past.  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   Social Determinants of Health   Financial Resource Strain: Not on file  Food Insecurity: Not on file  Transportation  Needs: Not on file  Physical Activity: Not on file  Stress: Not on file  Social Connections: Not on file    Allergies:  No Known Allergies  Labs:  Results for orders placed or performed during the hospital encounter of 12/04/22 (from the past 48 hour(s))  Urine rapid drug screen (hosp performed)     Status: None   Collection Time: 12/04/22  3:27 PM  Result Value Ref Range   Opiates NONE DETECTED NONE DETECTED   Cocaine NONE DETECTED NONE DETECTED   Benzodiazepines NONE DETECTED NONE DETECTED   Amphetamines NONE DETECTED NONE DETECTED   Tetrahydrocannabinol NONE DETECTED NONE DETECTED   Barbiturates NONE DETECTED NONE DETECTED    Comment: (NOTE) DRUG SCREEN FOR MEDICAL PURPOSES ONLY.  IF CONFIRMATION IS NEEDED FOR ANY PURPOSE, NOTIFY LAB WITHIN 5 DAYS.  LOWEST DETECTABLE LIMITS FOR URINE DRUG SCREEN Drug Class                     Cutoff (ng/mL) Amphetamine and metabolites    1000 Barbiturate and metabolites    200 Benzodiazepine                 200 Opiates and metabolites        300 Cocaine and metabolites        300 THC                            50 Performed at [facility], [address] [place], [place] [address]   Comprehensive metabolic panel     Status: Abnormal   Collection Time: 12/04/22  3:57 PM  Result Value Ref Range   Sodium 137 135 - 145 mmol/L   Potassium 3.5 3.5 - 5.1 mmol/L   Chloride 107 98 - 111 mmol/L   CO2 22 22 - 32 mmol/L   Glucose, Bld 112 (H) 70 - 99 mg/dL    Comment: Glucose reference range applies only to samples taken after fasting for at least 8 hours.   BUN 11 6 - 20 mg/dL   Creatinine, Ser 6.04 0.61 - 1.24 mg/dL   Calcium 8.9 8.9 - 54.0 mg/dL   Total Protein 6.7 6.5 - 8.1 g/dL   Albumin 4.1 3.5 - 5.0 g/dL   AST 24 15 - 41 U/L   ALT  28 0 - 44 U/L   Alkaline Phosphatase 62 38 - 126 U/L   Total Bilirubin 0.5 0.3 - 1.2 mg/dL   GFR, Estimated >16 >10 mL/min    Comment: (NOTE) Calculated using the CKD-EPI Creatinine  Equation (2021)    Anion gap 8 5 - 15    Comment: Performed at [facility], [address] [place], [place] [address]  Ethanol     Status: None   Collection Time: 12/04/22  3:57 PM  Result Value Ref Range   Alcohol, Ethyl (B) <10 <10 mg/dL    Comment: (NOTE) Lowest detectable limit for serum alcohol is 10 mg/dL.  For medical purposes only. Performed at [facility], [address], [place], [place] [address]   CBC with Diff     Status: None   Collection Time: 12/04/22  3:57 PM  Result Value Ref Range   WBC 6.3 4.0 - 10.5 K/uL   RBC 4.94 4.22 - 5.81 MIL/uL   Hemoglobin 15.8 13.0 - 17.0 g/dL   HCT 19.1 47.8 - 29.5 %   MCV 90.9 80.0 - 100.0 fL   MCH 32.0 26.0 - 34.0 pg   MCHC 35.2 30.0 - 36.0 g/dL   RDW 62.1 30.8 - 65.7 %   Platelets 217 150 - 400 K/uL   nRBC 0.0 0.0 - 0.2 %   Neutrophils Relative % 69 %   Neutro Abs 4.4 1.7 - 7.7 K/uL   Lymphocytes Relative 20 %   Lymphs Abs 1.3 0.7 - 4.0 K/uL   Monocytes Relative 9 %   Monocytes Absolute 0.5 0.1 - 1.0 K/uL   Eosinophils Relative 1 %   Eosinophils Absolute 0.0 0.0 - 0.5 K/uL   Basophils Relative 0 %   Basophils Absolute 0.0 0.0 - 0.1 K/uL   Immature Granulocytes 1 %   Abs Immature Granulocytes 0.03 0.00 - 0.07 K/uL    Comment: Performed at [facility], [address] [place], [place] [address]    No current facility-administered medications for this encounter.   Current Outpatient Medications  Medication Sig Dispense Refill   lurasidone (LATUDA) 40 MG TABS tablet Take 0.5 tablets (20 mg total) by mouth daily after supper. 14 tablet 0    Musculoskeletal: Strength & Muscle Tone: within normal limits Gait & Station: normal Patient leans: N/A   Psychiatric Specialty Exam: Presentation  General Appearance:  Bizarre  Eye Contact: Fleeting  Speech: Clear and Coherent  Speech Volume: Normal  Handedness: Right   Mood and Affect   Mood: Euphoric  Affect: Flat   Thought Process  Thought Processes: Disorganized  Descriptions of Associations:Loose  Orientation:Full (Time, Place and Person)  Thought Content:WDL  History of Schizophrenia/Schizoaffective disorder:No data recorded Duration of Psychotic Symptoms:No data recorded Hallucinations:Hallucinations: None  Ideas of Reference:None  Suicidal Thoughts:Suicidal Thoughts: No  Homicidal Thoughts:Homicidal Thoughts: No   Sensorium  Memory: Immediate Fair; Recent Fair  Judgment: Poor  Insight: Fair   Chartered certified accountant: Fair  Attention Span: Fair  Recall: [employer] of Knowledge: Fair  Language: Fair   Psychomotor Activity  Psychomotor Activity: Psychomotor Activity: Normal   Assets  Assets: Manufacturing systems engineer; Housing; Physical Health; Social Support    Sleep  Sleep: Sleep: Fair   Physical Exam: Physical Exam Vitals and nursing note reviewed. Exam conducted with a chaperone present.  Neurological:     Mental Status: He is alert.  Psychiatric:        Attention and Perception: Attention  normal.        Mood and Affect: Mood is anxious. Affect is labile.        Speech: Speech normal.        Behavior: Behavior is cooperative.        Thought Content: Thought content is delusional.        Judgment: Judgment is inappropriate.     Comments: Bizarre Affect    Review of Systems  Constitutional: Negative.   Psychiatric/Behavioral:  Positive for hallucinations.    Blood pressure (!) 149/96, pulse 90, resp. rate 18, SpO2 98 %. There is no height or weight on file to calculate BMI.   Medical Decision Making: Pt case reviewed and discussed with Dr. Jannifer Franklin. Pt does meet criteria for IVC and inpatient psychiatric treatment. Patient needs inpatient psychiatric admission for stabilization and treatment. [facility] and CSW notified of disposition. Will restart Latuda 20 mg after supper. EDP, RN, and LCSW  notified of disposition.     Disposition: Recommend psychiatric Inpatient admission.  Alona Bene, PMHNP 12/04/2022 5:13 PM

## 2022-12-04 NOTE — ED Notes (Signed)
Patient standing up on top of the bed saying he was trying to get the wifi to work better and was talking to the ceiling. Patient is giggling at the ceiling hysterically.

## 2022-12-05 DIAGNOSIS — F29 Unspecified psychosis not due to a substance or known physiological condition: Secondary | ICD-10-CM
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
## 2022-12-05 NOTE — ED Notes (Signed)
Patient passing the unit.  Patient seems irritated.

## 2022-12-05 NOTE — Progress Notes (Signed)
CSW received a call from [facility] and pt has been denied due to no payor source. CSW/Disposition team will continue to assist with inpatient behavioral health placement. Pt meets inpatient behavioral health placement per Earney Navy, NP-PMHNP-BC.   Maryjean Ka, MSW, [facility] 12/05/2022 9:42 PM
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
## 2022-12-05 NOTE — Progress Notes (Signed)
CSW followed up on referral and pt has been denied by [facility] due to no payor source. CSW sent referral to out of network providers once again:  [facility] Provider Address Phone Fax  [facility]  [address], [place] [address] [phone]  [facility]  [address] [place] [address] [phone]  [facility]  [address], [place] [address] [phone]  [facility]  [address] [place], [place] [address] [phone]  [facility] [address] [place]  [address] [place] [address] [phone]  [facility]  [address] [place], [place] [address] [phone]  [facility]  [address] [place] [address] [phone]  [facility]  [address] [place] [address] [phone]  [facility]  [address] [place] [address] [phone]  [facility]  [address] [place], [place] [address] [phone]  [facility]  [address] [place] [address] [phone]  [facility]  [address] [place] [address] [phone]  [facility]  [address] [place] [address] [phone]  [facility]  [address], [place] [address] [phone]  [facility]  [address], [place] [address] [phone]  [facility]  [address], [place] [address] [phone]  [facility]  [address] [place] [address] [phone]  [facility]  [address] [place] [address] [phone]  [facility]  [address] [place] [address] [phone]  [facility]  [address], [place] [address] [phone]  [facility]  [address] [place] [address] [phone]  [facility]  [address] [place] [address] [phone]  [facility]  [address], [place] [address] [phone]  [facility]  [address] [place] [address] [phone]  [facility]  [address], [place] [address] [phone]  [facility]  [address], [place] [address] [phone]  [facility]  [address], [place] [address] [phone]    Maryjean Ka, MSW, [facility] 12/05/2022 6:43 PM

## 2022-12-05 NOTE — Progress Notes (Signed)
LCSW Progress Note  [id number]   Mason Sanchez  12/05/2022  10:16 AM  Description:   Inpatient Psychiatric Referral  Patient was recommended inpatient per Dahlia Byes, NP. There are no [facility] beds at [facility], per [facility], RN. Patient was referred to the following out of network facilities:   [facility] Provider Address Phone Fax  [facility]  [address], [place] [address] [phone]  [facility]  [address] [place] [address] [phone]  [facility]  [address] [place] [address] [phone]  [facility]  [address] [place], [place] [address] [phone]  [facility] [address] [place]  [address], [place] [address] [phone]  [facility]  [address] [place], [place] [address] [phone]  [facility]  [address] [place] [address] [phone]  [facility]  [address] [place] [address] [phone]  [facility]  [address] [place] [address] [phone]  [facility]  [address] [place], [place] [address] [phone]  [facility]  [address] [place] [address] [phone]  [facility]  [address] [place] [address] [phone]  [facility]  [address] [place] [address] [phone]  [facility]  [address], [place] [address] [phone]  [facility]  [address], [place] [address] [phone]  [facility]  [address], [place] [address] [phone]  [facility]  [address] [place] [address] [phone]  [facility]  [address] [place] [address] [phone]  [facility]  [address], [place] [address] [phone]  [facility]  [address], [place] [address] [phone]  [facility]  [address] [place] [address] [phone]  [facility]  [address], [place] [address] [phone]  [facility]  [address], [place] [address] [phone]  [facility]  [address] [place] [address] [phone]  [facility]  [address] [place] [address] [phone]    Situation ongoing, CSW to continue following and update chart as more information becomes available.      Cathie Beams, LCSW  12/05/2022 10:16 AM

## 2022-12-05 NOTE — ED Provider Notes (Signed)
Emergency Medicine Observation Re-evaluation Note  Mason Sanchez is a 32 y.o. male, seen on rounds today.  Pt initially presented to the ED for complaints of Psychiatric Evaluation Currently, the patient is resting.  Physical Exam  BP 117/76 (BP Location: Left Arm)   Pulse 60   Temp 98.1 F (36.7 C) (Oral)   Resp 18   SpO2 100%  Physical Exam General: Asleep, no acute distress Cardiac: Regular rate Lungs: No increased work of breathing Psych: Calm, asleep  ED Course / MDM  EKG:EKG Interpretation Date/Time:  Sunday December 04 2022 16:05:07 EDT Ventricular Rate:  66 PR Interval:  124 QRS Duration:  96 QT Interval:  408 QTC Calculation: 427 R Axis:   81  Text Interpretation: Sinus rhythm Otherwise normal ECG No previous ECGs available Confirmed by Rolan Bucco [phone]) on 12/04/2022 4:15:34 PM  I have reviewed the labs performed to date as well as medications administered while in observation.  Recent changes in the last 24 hours include Agitation overnight. She was medically cleared and recommended for inpatient psych.  Plan  Current plan is for inpatient psych.    Rexford Maus, DO 12/05/22 [phone]

## 2022-12-05 NOTE — ED Notes (Addendum)
Patient has been alert and oriented this shift. Asked patient about taking medication. Patient refusing to take take medication, patient stated" my body  my choice".  Patient affect incongruent. Laughing inappropriately at times. Paranoid and overly worried about keeping things clean.

## 2022-12-06 ENCOUNTER — Encounter (HOSPITAL_COMMUNITY): Payer: Self-pay | Admitting: Nurse Practitioner

## 2022-12-06 ENCOUNTER — Other Ambulatory Visit: Payer: Self-pay

## 2022-12-06 ENCOUNTER — Inpatient Hospital Stay (HOSPITAL_COMMUNITY)
Admission: AD | Admit: 2022-12-06 | Discharge: 2022-12-14 | DRG: 885 | Disposition: A | Discharge status: Home / Self Care | Payer: No Typology Code available for payment source | Source: Intra-hospital | Attending: Psychiatry | Admitting: Psychiatry

## 2022-12-06 DIAGNOSIS — F250 Schizoaffective disorder, bipolar type: Secondary | ICD-10-CM | POA: Diagnosis present

## 2022-12-06 DIAGNOSIS — Z91199 Patient's noncompliance with other medical treatment and regimen due to unspecified reason: Secondary | ICD-10-CM | POA: Diagnosis not present

## 2022-12-06 DIAGNOSIS — F419 Anxiety disorder, unspecified: Secondary | ICD-10-CM | POA: Diagnosis present

## 2022-12-06 DIAGNOSIS — F259 Schizoaffective disorder, unspecified: Secondary | ICD-10-CM | POA: Diagnosis not present

## 2022-12-06 MED ORDER — ACETAMINOPHEN 325 MG PO TABS: 650.0000 mg | ORAL_TABLET | Freq: Four times a day (QID) | ORAL | Status: DC | PRN

## 2022-12-06 MED ORDER — ALUM & MAG HYDROXIDE-SIMETH 200-200-20 MG/5ML PO SUSP: 30.0000 mL | ORAL | Status: DC | PRN

## 2022-12-06 MED ORDER — HALOPERIDOL LACTATE 5 MG/ML IJ SOLN: 5.0000 mg | Freq: Three times a day (TID) | INTRAMUSCULAR | Status: DC | PRN

## 2022-12-06 MED ORDER — LORAZEPAM 2 MG/ML IJ SOLN: 2.0000 mg | Freq: Three times a day (TID) | INTRAMUSCULAR | Status: DC | PRN

## 2022-12-06 MED ORDER — DIPHENHYDRAMINE HCL 50 MG/ML IJ SOLN: 50.0000 mg | Freq: Three times a day (TID) | INTRAMUSCULAR | Status: DC | PRN

## 2022-12-06 MED ORDER — MAGNESIUM HYDROXIDE 400 MG/5ML PO SUSP: 30.0000 mL | Freq: Every day | ORAL | Status: DC | PRN

## 2022-12-06 MED ORDER — LORAZEPAM 1 MG PO TABS: 2.0000 mg | ORAL_TABLET | Freq: Three times a day (TID) | ORAL | Status: DC | PRN

## 2022-12-06 MED ORDER — LURASIDONE HCL 20 MG PO TABS
20.0000 mg | ORAL_TABLET | Freq: Every day | ORAL | Status: DC
  Filled 2022-12-06 (×3): qty 1

## 2022-12-06 MED ORDER — DIPHENHYDRAMINE HCL 25 MG PO CAPS: 50.0000 mg | ORAL_CAPSULE | Freq: Three times a day (TID) | ORAL | Status: DC | PRN

## 2022-12-06 MED ORDER — HALOPERIDOL 5 MG PO TABS: 5.0000 mg | ORAL_TABLET | Freq: Three times a day (TID) | ORAL | Status: DC | PRN

## 2022-12-06 NOTE — ED Notes (Signed)
Report called to [facility]   [employer] has been called as well   they will be enroute here after shift change

## 2022-12-06 NOTE — ED Notes (Signed)
Called [facility] to give report   nurse stated that the room wasn't empty yet that the patient hasn't left yet   she said to call back in an hour or so  she wasn't sure what the hold up was

## 2022-12-06 NOTE — Progress Notes (Signed)
Pt was accepted to [facility] TODAY 12/06/2022, pending EKG, Vital signs, and IVC paperwork faxed to [phone]. Bed assignment: 507-1  Pt meets inpatient criteria per Dahlia Byes, NP  Attending Physician will be Phineas Inches, MD  Report can be called to: - Adult unit: [phone]  Pt can arrive after 5:30 PM  Care Team Notified: [facility] Rona Ravens, RN, Florentina Addison, RN, Dahlia Byes, NP, and Latricia Heft, RN  Lambert Jeanty, LCSW  12/06/2022 11:01 AM

## 2022-12-06 NOTE — Progress Notes (Signed)
Adult Psychoeducational Group Note  Date:  12/06/2022 Time:  10:01 PM  Group Topic/Focus:  Wrap-Up Group:   The focus of this group is to help patients review their daily goal of treatment and discuss progress on daily workbooks.  Participation Level:  Did Not Attend  Participation Quality:    Affect:    Cognitive:    Insight:   Engagement in Group:    Modes of Intervention:    Additional Comments:  Pt did not attend group   Satira Anis 12/06/2022, 10:01 PM

## 2022-12-07 ENCOUNTER — Encounter (HOSPITAL_COMMUNITY): Payer: Self-pay

## 2022-12-07 DIAGNOSIS — F250 Schizoaffective disorder, bipolar type: Principal | ICD-10-CM

## 2022-12-07 MED ORDER — TRAZODONE HCL 50 MG PO TABS
50.0000 mg | ORAL_TABLET | Freq: Every evening | ORAL | Status: DC | PRN
  Filled 2022-12-07: qty 1
  Filled 2022-12-07 (×2): qty 7

## 2022-12-07 MED ORDER — RISPERIDONE 1 MG PO TABS
1.0000 mg | ORAL_TABLET | Freq: Two times a day (BID) | ORAL | Status: DC
  Administered 2022-12-10 – 2022-12-14 (×9): 1 mg via ORAL
  Filled 2022-12-07 (×21): qty 1

## 2022-12-07 MED ORDER — HYDROXYZINE HCL 25 MG PO TABS
25.0000 mg | ORAL_TABLET | Freq: Three times a day (TID) | ORAL | Status: DC | PRN
  Filled 2022-12-07: qty 10

## 2022-12-07 NOTE — [facility] Group Notes (Signed)
The focus of this group is to identify appropriate goals for recovery and establish a plan to achieve them.     Scale 1-10:  10 out of 10     Goal for the day: Relax and enjoy the day

## 2022-12-07 NOTE — Progress Notes (Signed)
   12/07/22 1200  Psych Admission Type (Psych Patients Only)  Admission Status Involuntary  Psychosocial Assessment  Patient Complaints None  Eye Contact Fair  Facial Expression Animated  Affect Inconsistent with thought content  Speech Logical/coherent  Interaction Cautious;Guarded;Minimal  Motor Activity Fidgety  Appearance/Hygiene In scrubs  Behavior Characteristics Fidgety  Mood Suspicious;Apprehensive  Thought Process  Coherency WDL  Content Blaming others;Paranoia  Delusions Paranoid  Perception WDL  Hallucination Auditory;Visual (currently denies)  Judgment Poor  Confusion None  Danger to Self  Current suicidal ideation? Denies  Danger to Others  Danger to Others None reported or observed

## 2022-12-07 NOTE — Progress Notes (Signed)
Writer spoke with patient 1:1 and he reports having had a good day. His father came to visit him on tonight. Writer did ask patient why he did not want to take medication and he responded that he believes in meditation and not medication. He is very pleasant and reports that he plans to do some meditating in his room. Support given and safety maintained with 15 min checks.

## 2022-12-07 NOTE — Progress Notes (Signed)
Recreation Therapy Notes  INPATIENT RECREATION THERAPY ASSESSMENT  Patient Details Name: Mason Sanchez MRN: [id number] DOB: 1990-12-20 Today's Date: 12/07/2022       Information Obtained From: Patient  Able to Participate in Assessment/Interview: Yes  Patient Presentation: Alert (Some random laughing)  Reason for Admission (Per Patient): Other (Comments) (Per chart: paranoid behavior and hallucinations)  Patient Stressors:  (None identified)  Coping Skills:   Sports, Deep Breathing, Meditate, Prayer, Exercise, Music, Talk, Hot Bath/Shower, Read  Leisure Interests (2+):  Community - Other (Comment), Individual - Other (Comment) (Cooking; Artist)  Frequency of Recreation/Participation: Other (Comment) (Cooking-Daily; [employer]- Weekly)  Awareness of Community Resources:  Yes  Community Resources:  Research scientist (physical sciences), Engineer, petroleum  Current Use: Yes  If no, Barriers?:    Expressed Interest in [employer] Information: No  [employer] of Residence:  Engineer, technical sales  Patient Main Form of Transportation: Other (Comment) (Pt stated sometimes he rides his bike or his dad with take him where he needs to go.)  Patient Strengths:  Listening; Leader  Patient Identified Areas of Improvement:  "Walking away when relalizing someone is not going to change"  Patient Goal for Hospitalization:  "lose weight"  Current SI (including self-harm):  No  Current HI:  No  Current AVH: No  Staff Intervention Plan: Group Attendance, Collaborate with Interdisciplinary Treatment Team  Consent to Intern Participation: N/A   Jatorian Renault-McCall, LRT,CTRS Json Koelzer A Jerrion Tabbert-McCall 12/07/2022, 2:59 PM

## 2022-12-07 NOTE — Group Note (Signed)
Date:  12/07/2022 Time:  8:46 PM  Group Topic/Focus:  Wrap-Up Group:   The focus of this group is to help patients review their daily goal of treatment and discuss progress on daily workbooks.    Participation Level:  Active  Participation Quality:  Appropriate  Affect:  Appropriate  Cognitive:  Appropriate  Insight: Appropriate  Engagement in Group:  Engaged  Modes of Intervention:  Education and Exploration  Additional Comments:  Patient attended and participated in group tonight. He reports that the most important thing that happen for him today was to see the sunshine this morning. He has been across the street without a window for three days.  Lita Mains [facility] 12/07/2022, 8:46 PM

## 2022-12-07 NOTE — Group Note (Signed)
Recreation Therapy Group Note   Group Topic:Problem Solving  Group Date: 12/07/2022 Start Time: 1042 End Time: 1110 Facilitators: Farouk Vivero-McCall, LRT,CTRS Location: [address] Dayroom   Goal Area(s) Addresses:  Patient will effectively work in a team with other group members. Patient will verbalize importance of using appropriate problem solving techniques.  Patient will identify positive change associated with effective problem solving skills.   Group Description: Brain Teasers.  Patients were given two sheets of brain teasers. Patients were to complete the teasers to the best of their ability. Once completed, LRT and patients reviewed the answers. Patients would add all the questions they got correct, the person with the most correct answers got a prize. If there was a tie, LRT would ask a tie breaker question.    Affect/Mood: Appropriate   Participation Level: Moderate   Participation Quality: Independent   Behavior: Cooperative   Speech/Thought Process: Rational   Insight: Moderate   Judgement: Moderate   Modes of Intervention: Problem-solving   Patient Response to Interventions:  Receptive   Education Outcome:  Acknowledges education   Clinical Observations/Individualized Feedback: Pt was cooperative and attentive to the activity. Pt had moments were he would laugh out the blue but was appropriate during group.     Plan: Continue to engage patient in RT group sessions 2-3x/week.   Melizza Kanode-McCall, LRT,CTRS  12/07/2022 2:02 PM

## 2022-12-07 NOTE — Progress Notes (Signed)
   12/06/22 2000  Psych Admission Type (Psych Patients Only)  Admission Status Involuntary  Psychosocial Assessment  Patient Complaints None  Eye Contact Fair  Facial Expression Animated  Affect Inconsistent with thought content  Speech Logical/coherent  Interaction Minimal;Cautious;Guarded  Motor Activity Fidgety  Appearance/Hygiene In scrubs  Behavior Characteristics Unwilling to participate;Fidgety  Mood Suspicious;Apprehensive  Thought Process  Coherency WDL  Content Paranoia  Delusions Paranoid  Perception WDL  Hallucination None reported or observed  Judgment Poor  Confusion None  Danger to Self  Current suicidal ideation? Denies  Danger to Others  Danger to Others None reported or observed

## 2022-12-07 NOTE — BH IP Treatment Plan (Signed)
Interdisciplinary Treatment and Diagnostic Plan Update  12/07/2022 Time of Session: 11:00am Mason Sanchez MRN: [id number]  Principal Diagnosis: Schizoaffective disorder, bipolar type [facility])  Secondary Diagnoses: Principal Problem:   Schizoaffective disorder, bipolar type (HCC)   Current Medications:  Current Facility-Administered Medications  Medication Dose Route Frequency Provider Last Rate Last Admin   acetaminophen (TYLENOL) tablet 650 mg  650 mg Oral Q6H PRN Onuoha, Josephine C, NP       alum & mag hydroxide-simeth (MAALOX/MYLANTA) 200-200-20 MG/5ML suspension 30 mL  30 mL Oral Q4H PRN Onuoha, Josephine C, NP       diphenhydrAMINE (BENADRYL) capsule 50 mg  50 mg Oral TID PRN Earney Navy, NP       Or   diphenhydrAMINE (BENADRYL) injection 50 mg  50 mg Intramuscular TID PRN Dahlia Byes C, NP       haloperidol (HALDOL) tablet 5 mg  5 mg Oral TID PRN Dahlia Byes C, NP       Or   haloperidol lactate (HALDOL) injection 5 mg  5 mg Intramuscular TID PRN Dahlia Byes C, NP       LORazepam (ATIVAN) tablet 2 mg  2 mg Oral TID PRN Earney Navy, NP       Or   LORazepam (ATIVAN) injection 2 mg  2 mg Intramuscular TID PRN Dahlia Byes C, NP       lurasidone (LATUDA) tablet 20 mg  20 mg Oral QPC supper Onuoha, Josephine C, NP       magnesium hydroxide (MILK OF MAGNESIA) suspension 30 mL  30 mL Oral Daily PRN Earney Navy, NP       PTA Medications: Medications Prior to Admission  Medication Sig Dispense Refill Last Dose   lurasidone (LATUDA) 40 MG TABS tablet Take 0.5 tablets (20 mg total) by mouth daily after supper. (Patient not taking: Reported on 12/04/2022) 14 tablet 0     Patient Stressors: Marital or family conflict    Patient Strengths: Supportive family/friends   Treatment Modalities: Medication Management, Group therapy, Case management,  1 to 1 session with clinician, Psychoeducation, Recreational therapy.   Physician Treatment Plan for  Primary Diagnosis: Schizoaffective disorder, bipolar type (HCC) Long Term Goal(s):     Short Term Goals:    Medication Management: Evaluate patient's response, side effects, and tolerance of medication regimen.  Therapeutic Interventions: 1 to 1 sessions, Unit Group sessions and Medication administration.  Evaluation of Outcomes: Progressing  Physician Treatment Plan for Secondary Diagnosis: Principal Problem:   Schizoaffective disorder, bipolar type (HCC)  Long Term Goal(s):     Short Term Goals:       Medication Management: Evaluate patient's response, side effects, and tolerance of medication regimen.  Therapeutic Interventions: 1 to 1 sessions, Unit Group sessions and Medication administration.  Evaluation of Outcomes: Progressing   RN Treatment Plan for Primary Diagnosis: Schizoaffective disorder, bipolar type (HCC) Long Term Goal(s): Knowledge of disease and therapeutic regimen to maintain health will improve  Short Term Goals: Ability to remain free from injury will improve, Ability to verbalize frustration and anger appropriately will improve, Ability to demonstrate self-control, Ability to participate in decision making will improve, Ability to verbalize feelings will improve, Ability to disclose and discuss suicidal ideas, Ability to identify and develop effective coping behaviors will improve, and Compliance with prescribed medications will improve  Medication Management: RN will administer medications as ordered by provider, will assess and evaluate patient's response and provide education to patient for prescribed medication. RN will report  any adverse and/or side effects to prescribing provider.  Therapeutic Interventions: 1 on 1 counseling sessions, Psychoeducation, Medication administration, Evaluate responses to treatment, Monitor vital signs and CBGs as ordered, Perform/monitor CIWA, COWS, AIMS and Fall Risk screenings as ordered, Perform wound care treatments as  ordered.  Evaluation of Outcomes: Progressing   LCSW Treatment Plan for Primary Diagnosis: Schizoaffective disorder, bipolar type (HCC) Long Term Goal(s): Safe transition to appropriate next level of care at discharge, Engage patient in therapeutic group addressing interpersonal concerns.  Short Term Goals: Engage patient in aftercare planning with referrals and resources, Increase social support, Increase ability to appropriately verbalize feelings, Increase emotional regulation, Facilitate acceptance of mental health diagnosis and concerns, Facilitate patient progression through stages of change regarding substance use diagnoses and concerns, Identify triggers associated with mental health/substance abuse issues, and Increase skills for wellness and recovery  Therapeutic Interventions: Assess for all discharge needs, 1 to 1 time with Social worker, Explore available resources and support systems, Assess for adequacy in community support network, Educate family and significant other(s) on suicide prevention, Complete Psychosocial Assessment, Interpersonal group therapy.  Evaluation of Outcomes: Progressing   Progress in Treatment: Attending groups: Yes. Participating in groups: Yes. Taking medication as prescribed: Yes. Toleration medication: Yes. Family/Significant other contact made: No, will contact:  CSW will assess and identify social support Patient understands diagnosis: No. Discussing patient identified problems/goals with staff: Yes. Medical problems stabilized or resolved: Yes. Denies suicidal/homicidal ideation: Yes. Issues/concerns per patient self-inventory: No.   New problem(s) identified: No, Describe:  none reported  New Short Term/Long Term Goal(s):   medication stabilization, elimination of SI thoughts, development of comprehensive mental wellness plan.    Patient Goals:  Pt states, "I want to be discharged"   Discharge Plan or Barriers: Patient recently  admitted. CSW will continue to follow and assess for appropriate referrals and possible discharge planning.    Reason for Continuation of Hospitalization: Aggression Anxiety Depression Medication stabilization  Estimated Length of Stay: 3-5 days  Last 3 [place] Suicide Severity Risk Score: Flowsheet Row [facility] (Current[facility]/04[facility]2022 in [facility] ED from 11/24/2022 in [facility]  C-SSRS RISK CATEGORY No Risk No Risk No Risk       Last PHQ 2/9 Scores:    10/29/2015    2:25 PM  Depression screen PHQ 2/9  Decreased Interest 0  Down, Depressed, Hopeless 0  PHQ - 2 Score 0    Scribe for Treatment Team: Beatris Si, LCSW 12/07/2022 1:23 PM
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
## 2022-12-07 NOTE — Tx Team (Signed)
Initial Treatment Plan 12/07/2022 6:03 AM [name] ZDG:[id number]    PATIENT STRESSORS: Marital or family conflict     PATIENT STRENGTHS: Supportive family/friends    PATIENT IDENTIFIED PROBLEMS: Denies any problems, states his parents got mad and IVC'ed him.                      DISCHARGE CRITERIA:  Improved stabilization in mood, thinking, and/or behavior Motivation to continue treatment in a less acute level of care Reduction of life-threatening or endangering symptoms to within safe limits Verbal commitment to aftercare and medication compliance  PRELIMINARY DISCHARGE PLAN: Return to previous living arrangement Return to previous work or school arrangements  PATIENT/FAMILY INVOLVEMENT: This treatment plan has been presented to and reviewed with the patient, [facility].  The patient has been given the opportunity to ask questions and make suggestions.  Marja Kays, RN 12/07/2022, 6:03 AM

## 2022-12-07 NOTE — Progress Notes (Signed)
   12/07/22 2030  Psych Admission Type (Psych Patients Only)  Admission Status Involuntary  Psychosocial Assessment  Patient Complaints None  Eye Contact Fair  Facial Expression Animated  Affect Preoccupied;Inconsistent with thought content  Speech Logical/coherent  Interaction Cautious;Guarded;Minimal  Motor Activity Restless  Appearance/Hygiene Meticulous  Behavior Characteristics Appropriate to situation;Fidgety;Guarded  Mood Preoccupied;Pleasant  Thought Process  Coherency Loose associations  Content Preoccupation;Blaming others  Delusions Paranoid  Perception WDL  Hallucination Auditory;Visual  Judgment Limited  Confusion None  Danger to Self  Current suicidal ideation? Denies
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
## 2022-12-07 NOTE — Progress Notes (Addendum)
Pt observed with blunted affect, irritable / labile mood, very oppositional, pressured speech, argumentative and resistant to care on interactions. Refused to wear socks, refused vitals and his scheduled medications on multiple approaches this shift despite verbal education on safety in milieu (falls, disruptive behavior). Per pt "I'm not taking anything or going back to no unit or taking any medications since no one talk to me about it". Assigned provider made aware. Returned early from [employer] as he will not wear his socks, threw them in the trash can while in the cafeteria. Shoes obtained from locker, lace removed, given to pt to foster compliance but he still took his shoes off and was barefooted. New order received for unit restriction with plan further discussion on treatment plan tomorrow due to current status. Safety checks maintained at Q 15 minutes intervals. Continued support, encouragement and reassurance offered.  Continue to require multiple prompts due to mood lability and resistance to comply with unit rules.

## 2022-12-08 ENCOUNTER — Encounter (HOSPITAL_COMMUNITY): Payer: Self-pay

## 2022-12-08 ENCOUNTER — Ambulatory Visit (HOSPITAL_COMMUNITY)
Admission: AD | Admit: 2022-12-08 | Discharge: 2022-12-08 | Disposition: A | Discharge status: Home / Self Care | Payer: No Typology Code available for payment source | Source: Intra-hospital | Attending: Behavioral Health | Admitting: Behavioral Health

## 2022-12-08 NOTE — Progress Notes (Signed)
Pt continues to refuse medications    12/08/22 2315  Psych Admission Type (Psych Patients Only)  Admission Status Involuntary  Psychosocial Assessment  Patient Complaints None  Eye Contact Fair  Facial Expression Animated  Affect Preoccupied;Inconsistent with thought content  Speech Logical/coherent  Interaction Assertive  Motor Activity Restless  Appearance/Hygiene Unremarkable  Behavior Characteristics Fidgety  Mood Pleasant  Aggressive Behavior  Effect No apparent injury  Thought Process  Coherency Circumstantial;Loose associations  Content Preoccupation  Delusions Paranoid  Perception Hallucinations  Hallucination Auditory  Judgment Impaired  Confusion None  Danger to Self  Current suicidal ideation? Denies  Danger to Others  Danger to Others None reported or observed

## 2022-12-08 NOTE — Progress Notes (Signed)
Patient ID: [facility], male   DOB: 23-Jul-1990, 32 y.o.   MRN: [id number]   Pt refused morning vital signs.

## 2022-12-08 NOTE — Progress Notes (Signed)
   12/08/22 0858  Psych Admission Type (Psych Patients Only)  Admission Status Involuntary  Psychosocial Assessment  Patient Complaints None  Eye Contact Fair  Facial Expression Animated  Affect Preoccupied;Inconsistent with thought content  Speech Logical/coherent  Interaction Assertive  Motor Activity Restless  Appearance/Hygiene Unremarkable  Behavior Characteristics Unwilling to participate;Restless  Mood Pleasant  Thought Process  Coherency Loose associations  Content Preoccupation  Delusions Paranoid  Perception Hallucinations  Hallucination Auditory;Visual  Judgment Limited  Confusion None  Danger to Self  Current suicidal ideation? Denies  Danger to Others  Danger to Others None reported or observed
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
## 2022-12-08 NOTE — Plan of Care (Signed)
  Problem: Coping: Goal: Ability to verbalize frustrations and anger appropriately will improve Outcome: Progressing   Problem: Coping: Goal: Ability to demonstrate self-control will improve Outcome: Progressing   Problem: Safety: Goal: Periods of time without injury will increase Outcome: Progressing   Problem: Education: Goal: Knowledge of the prescribed therapeutic regimen will improve Outcome: Not Progressing   Problem: Health Behavior/Discharge Planning: Goal: Compliance with prescribed medication regimen will improve Outcome: Not Progressing

## 2022-12-08 NOTE — Group Note (Signed)
Date:  12/08/2022 Time:  9:32 PM  Group Topic/Focus:  Wrap-Up Group:   The focus of this group is to help patients review their daily goal of treatment and discuss progress on daily workbooks.    Participation Level:  Active  Participation Quality:  Appropriate  Affect:  Appropriate  Cognitive:  Appropriate  Insight: Appropriate  Engagement in Group:  Engaged  Modes of Intervention:  Activity and Problem-solving  Additional Comments: None  Tacy Dura 12/08/2022, 9:32 PM

## 2022-12-08 NOTE — Progress Notes (Signed)
[facility] LCSW Progress Note   12/08/2022    10:40 AM  Mason Sanchez      Type of Note: Assessment Attempt   CSW tried to complete patient assessment and patient exact words, " I have answered all these question already so we are done ".     Signed:   Jacob Moores, MSW, LCSWA 12/08/2022 10:40 AM

## 2022-12-08 NOTE — Progress Notes (Signed)
   12/08/22 0548  15 Minute Checks  Location Bedroom  Visual Appearance Calm  Behavior Sleeping  Sleep (Behavioral Health Patients Only)  Calculate sleep? (Click Yes once per 24 hr at 0600 safety check) Yes  Documented sleep last 24 hours 7.75

## 2022-12-08 NOTE — Group Note (Signed)
Date:  12/08/2022 Time:  9:46 AM  Group Topic/Focus:  Goals Group:   The focus of this group is to help patients establish daily goals to achieve during treatment and discuss how the patient can incorporate goal setting into their daily lives to aide in recovery. Orientation:   The focus of this group is to educate the patient on the purpose and policies of crisis stabilization and provide a format to answer questions about their admission.  The group details unit policies and expectations of patients while admitted.  Patient did not attend group.   Mason Sanchez 12/08/2022, 9:46 AM

## 2022-12-09 ENCOUNTER — Encounter (HOSPITAL_COMMUNITY): Payer: Self-pay

## 2022-12-09 LAB — HEMOGLOBIN A1C
Hgb A1c MFr Bld: 4.8 % (ref 4.8–5.6)
Mean Plasma Glucose: 91.06 mg/dL

## 2022-12-09 LAB — HIV ANTIBODY (ROUTINE TESTING W REFLEX): HIV Screen 4th Generation wRfx: NONREACTIVE

## 2022-12-09 LAB — LIPID PANEL
Cholesterol: 129 mg/dL (ref 0–200)
HDL: 44 mg/dL (ref >40)
LDL Cholesterol: 77 mg/dL (ref 0–99)
Total CHOL/HDL Ratio: 2.9 RATIO
Triglycerides: 42 mg/dL (ref <150)
VLDL: 8 mg/dL (ref 0–40)

## 2022-12-09 LAB — SEDIMENTATION RATE: Sed Rate: 0 mm/hr (ref 0–16)

## 2022-12-09 LAB — VITAMIN B12: Vitamin B-12: 407 pg/mL (ref 180–914)

## 2022-12-09 LAB — TSH: TSH: 2.194 u[IU]/mL (ref 0.350–4.500)

## 2022-12-09 LAB — RPR: RPR Ser Ql: NONREACTIVE

## 2022-12-09 NOTE — Progress Notes (Signed)
   12/09/22 1000  Psych Admission Type (Psych Patients Only)  Admission Status Involuntary  Psychosocial Assessment  Patient Complaints None  Eye Contact Fair  Facial Expression Animated  Affect Appropriate to circumstance  Speech Logical/coherent  Interaction Assertive  Motor Activity Restless  Appearance/Hygiene Revealing clothes/seductive clothing  Behavior Characteristics Fidgety  Mood Pleasant  Thought Process  Coherency Circumstantial  Content Preoccupation  Delusions Paranoid  Perception Hallucinations  Hallucination None reported or observed  Judgment Impaired  Confusion None  Danger to Self  Current suicidal ideation? Denies  Danger to Others  Danger to Others None reported or observed

## 2022-12-09 NOTE — Progress Notes (Signed)
   12/09/22 2015  Psych Admission Type (Psych Patients Only)  Admission Status Involuntary  Psychosocial Assessment  Patient Complaints None  Eye Contact Fair  Facial Expression Animated  Affect Preoccupied;Inconsistent with thought content  Speech Logical/coherent  Interaction Assertive  Motor Activity Restless  Appearance/Hygiene Unremarkable  Behavior Characteristics Fidgety  Mood Pleasant  Aggressive Behavior  Effect No apparent injury  Thought Process  Coherency Circumstantial;Loose associations  Content Preoccupation  Delusions Paranoid  Perception Hallucinations  Hallucination Auditory  Judgment Impaired  Confusion None  Danger to Self  Current suicidal ideation? Denies  Danger to Others  Danger to Others None reported or observed

## 2022-12-09 NOTE — BH IP Treatment Plan (Signed)
Interdisciplinary Treatment and Diagnostic Plan Update  12/09/2022 Time of Session: 1130 Mason Sanchez MRN: [id number]  Principal Diagnosis: Schizoaffective disorder, bipolar type [facility])  Secondary Diagnoses: Principal Problem:   Schizoaffective disorder, bipolar type (HCC)   Current Medications:  Current Facility-Administered Medications  Medication Dose Route Frequency Provider Last Rate Last Admin   acetaminophen (TYLENOL) tablet 650 mg  650 mg Oral Q6H PRN Welford Roche, Josephine C, NP       alum & mag hydroxide-simeth (MAALOX/MYLANTA) 200-200-20 MG/5ML suspension 30 mL  30 mL Oral Q4H PRN Onuoha, Josephine C, NP       diphenhydrAMINE (BENADRYL) capsule 50 mg  50 mg Oral TID PRN Earney Navy, NP       Or   diphenhydrAMINE (BENADRYL) injection 50 mg  50 mg Intramuscular TID PRN Dahlia Byes C, NP       haloperidol (HALDOL) tablet 5 mg  5 mg Oral TID PRN Dahlia Byes C, NP       Or   haloperidol lactate (HALDOL) injection 5 mg  5 mg Intramuscular TID PRN Earney Navy, NP       hydrOXYzine (ATARAX) tablet 25 mg  25 mg Oral TID PRN Ntuen, Jesusita Oka, FNP       LORazepam (ATIVAN) tablet 2 mg  2 mg Oral TID PRN Earney Navy, NP       Or   LORazepam (ATIVAN) injection 2 mg  2 mg Intramuscular TID PRN Dahlia Byes C, NP       magnesium hydroxide (MILK OF MAGNESIA) suspension 30 mL  30 mL Oral Daily PRN Welford Roche, Josephine C, NP       risperiDONE (RISPERDAL) tablet 1 mg  1 mg Oral BID Ntuen, Jesusita Oka, FNP       traZODone (DESYREL) tablet 50 mg  50 mg Oral QHS PRN Ntuen, Jesusita Oka, FNP       PTA Medications: Medications Prior to Admission  Medication Sig Dispense Refill Last Dose   lurasidone (LATUDA) 40 MG TABS tablet Take 0.5 tablets (20 mg total) by mouth daily after supper. (Patient not taking: Reported on 12/04/2022) 14 tablet 0     Patient Stressors: Marital or family conflict    Patient Strengths: Supportive family/friends   Treatment Modalities: Medication  Management, Group therapy, Case management,  1 to 1 session with clinician, Psychoeducation, Recreational therapy.   Physician Treatment Plan for Primary Diagnosis: Schizoaffective disorder, bipolar type (HCC) Long Term Goal(s): Improvement in symptoms so as ready for discharge   Short Term Goals: Ability to identify changes in lifestyle to reduce recurrence of condition will improve Ability to verbalize feelings will improve Ability to disclose and discuss suicidal ideas Ability to demonstrate self-control will improve Ability to identify and develop effective coping behaviors will improve Ability to maintain clinical measurements within normal limits will improve Compliance with prescribed medications will improve Ability to identify triggers associated with substance abuse/mental health issues will improve  Medication Management: Evaluate patient's response, side effects, and tolerance of medication regimen.  Therapeutic Interventions: 1 to 1 sessions, Unit Group sessions and Medication administration.  Evaluation of Outcomes: Progressing  Physician Treatment Plan for Secondary Diagnosis: Principal Problem:   Schizoaffective disorder, bipolar type (HCC)  Long Term Goal(s): Improvement in symptoms so as ready for discharge   Short Term Goals: Ability to identify changes in lifestyle to reduce recurrence of condition will improve Ability to verbalize feelings will improve Ability to disclose and discuss suicidal ideas Ability to demonstrate self-control will improve Ability  to identify and develop effective coping behaviors will improve Ability to maintain clinical measurements within normal limits will improve Compliance with prescribed medications will improve Ability to identify triggers associated with substance abuse/mental health issues will improve     Medication Management: Evaluate patient's response, side effects, and tolerance of medication regimen.  Therapeutic  Interventions: 1 to 1 sessions, Unit Group sessions and Medication administration.  Evaluation of Outcomes: Progressing   RN Treatment Plan for Primary Diagnosis: Schizoaffective disorder, bipolar type (HCC) Long Term Goal(s): Knowledge of disease and therapeutic regimen to maintain health will improve  Short Term Goals: Ability to remain free from injury will improve, Ability to verbalize frustration and anger appropriately will improve, Ability to demonstrate self-control, Ability to participate in decision making will improve, Ability to verbalize feelings will improve, Ability to disclose and discuss suicidal ideas, Ability to identify and develop effective coping behaviors will improve, and Compliance with prescribed medications will improve  Medication Management: RN will administer medications as ordered by provider, will assess and evaluate patient's response and provide education to patient for prescribed medication. RN will report any adverse and/or side effects to prescribing provider.  Therapeutic Interventions: 1 on 1 counseling sessions, Psychoeducation, Medication administration, Evaluate responses to treatment, Monitor vital signs and CBGs as ordered, Perform/monitor CIWA, COWS, AIMS and Fall Risk screenings as ordered, Perform wound care treatments as ordered.  Evaluation of Outcomes: Progressing   LCSW Treatment Plan for Primary Diagnosis: Schizoaffective disorder, bipolar type (HCC) Long Term Goal(s): Safe transition to appropriate next level of care at discharge, Engage patient in therapeutic group addressing interpersonal concerns.  Short Term Goals: Engage patient in aftercare planning with referrals and resources, Increase social support, Increase ability to appropriately verbalize feelings, Increase emotional regulation, Facilitate acceptance of mental health diagnosis and concerns, Facilitate patient progression through stages of change regarding substance use diagnoses  and concerns, Identify triggers associated with mental health/substance abuse issues, and Increase skills for wellness and recovery  Therapeutic Interventions: Assess for all discharge needs, 1 to 1 time with Social worker, Explore available resources and support systems, Assess for adequacy in community support network, Educate family and significant other(s) on suicide prevention, Complete Psychosocial Assessment, Interpersonal group therapy.  Evaluation of Outcomes: Progressing   Progress in Treatment: Attending groups: Yes. Participating in groups: Yes. Taking medication as prescribed: Yes. Toleration medication: Yes. Family/Significant other contact made: No, will contact:  Pt declined Patient understands diagnosis: Yes. Discussing patient identified problems/goals with staff: Yes. Medical problems stabilized or resolved: Yes. Denies suicidal/homicidal ideation: Yes. Issues/concerns per patient self-inventory: Yes. Other: N/A  New problem(s) identified: No, Describe:  None reported  New Short Term/Long Term Goal(s): medication stabilization, elimination of SI thoughts, development of comprehensive mental wellness plan.   Patient Goals:  Coping Skills  Discharge Plan or Barriers: Patient recently admitted. CSW will continue to follow and assess for appropriate referrals and possible discharge planning.   Reason for Continuation of Hospitalization: Delusions  Hallucinations Medication stabilization  Estimated Length of Stay: 3-7 Days  Last 3 [place] Suicide Severity Risk Score: Flowsheet Row Ad[facility] (Current) [facility]/04/2022 in [facility] ED from [facility]/24/2022 in [facility]  C-SSRS RISK CATEGORY No Risk No Risk No Risk       Last PHQ 2/9 Scores:    10/29/2015    2:25 PM  Depression screen PHQ 2/9  Decreased Interest 0  Down,  Depressed, Hopeless 0  PHQ - 2 Score 0     medication stabilization, elimination of SI thoughts, development of comprehensive mental wellness plan.   Scribe for Treatment Team: Ane Payment, LCSW 12/09/2022 3:11 PM

## 2022-12-09 NOTE — Progress Notes (Signed)
Did not attend group 

## 2022-12-09 NOTE — Group Note (Signed)
Recreation Therapy Group Note   Group Topic:Team Building  Group Date: 12/09/2022 Start Time: 1025 End Time: 1040 Facilitators: Jahari Billy-McCall, LRT,CTRS Location: [address] Dayroom   Goal Area(s) Addresses:  Patient will effectively work with peer towards shared goal.  Patient will identify skills used to make activity successful.  Patient will identify how skills used during activity can be used to reach post d/c goals.   Group Description: Straw Bridge. In teams of 3-5, patients were given 15 plastic drinking straws and an equal length of masking tape. Using the materials provided, patients were instructed to build a free standing bridge-like structure to suspend an everyday item (ex: puzzle box) off of the floor or table surface. All materials were required to be used by the team in their design. LRT facilitated post-activity discussion reviewing team process. Patients were encouraged to reflect how the skills used in this activity can be generalized to daily life post discharge.    Affect/Mood: N/A   Participation Level: Did not attend    Clinical Observations/Individualized Feedback:     Plan: Continue to engage patient in RT group sessions 2-3x/week.   Derryck Shahan-McCall, LRT,CTRS  12/09/2022 12:53 PM

## 2022-12-09 NOTE — Plan of Care (Signed)
  Problem: Education: Goal: Emotional status will improve Outcome: Progressing Goal: Mental status will improve Outcome: Progressing   
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
## 2022-12-09 NOTE — Group Note (Signed)
Date:  12/09/2022 Time:  8:44 PM  Group Topic/Focus:  Wrap-Up Group:   The focus of this group is to help patients review their daily goal of treatment and discuss progress on daily workbooks.    Participation Level:  Did Not Attend  Participation Quality:   n/a  Affect:   n/a  Cognitive:   n/a  Insight: None  Engagement in Group:   n/a  Modes of Intervention:   n/a  Additional Comments:  Pt did not attend group  Jolyssa Oplinger E Chelcea Zahn 12/09/2022, 8:44 PM

## 2022-12-09 NOTE — Group Note (Deleted)
Date:  12/09/2022 Time:  3:31 PM  Group Topic/Focus:  Diagnosis Education:   The focus of this group is to discuss the major disorders that patients maybe diagnosed with.  Group discusses the importance of knowing what one's diagnosis is so that one can understand treatment and better advocate for oneself.     Participation Level:  {BHH PARTICIPATION LEVEL:22264}  Participation Quality:  {BHH PARTICIPATION QUALITY:22265}  Affect:  {BHH AFFECT:22266}  Cognitive:  {BHH COGNITIVE:22267}  Insight: {BHH Insight2:20797}  Engagement in Group:  {BHH ENGAGEMENT IN GROUP:22268}  Modes of Intervention:  {BHH MODES OF INTERVENTION:22269}  Additional Comments:  ***  Mason Sanchez W Brandalyn Harting 12/09/2022, 3:31 PM  

## 2022-12-09 NOTE — BHH Group Notes (Signed)
Spiritual care group facilitated by Chaplain Dyanne Carrel, [facility]  Group focused on topic of strength. Group members reflected on what thoughts and feelings emerge when they hear this topic. They then engaged in facilitated dialog around how strength is present in their lives. This dialog focused on representing what strength had been to them in their lives (images and patterns given) and what they saw as helpful in their life now (what they needed / wanted).  Activity drew on narrative framework.  Patient Progress: Mason Sanchez attended group and actively engaged and participated in group conversation and activities.  His comments were on topic, though sometimes superficial in nature.  At other times, his comments demonstrated good insight.
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
## 2022-12-09 NOTE — Group Note (Deleted)
Date:  12/09/2022 Time:  1:46 PM  Group Topic/Focus:  Goals Group:   The focus of this group is to help patients establish daily goals to achieve during treatment and discuss how the patient can incorporate goal setting into their daily lives to aide in recovery. Orientation:   The focus of this group is to educate the patient on the purpose and policies of crisis stabilization and provide a format to answer questions about their admission.  The group details unit policies and expectations of patients while admitted.     Participation Level:  {BHH PARTICIPATION LEVEL:22264}  Participation Quality:  {BHH PARTICIPATION QUALITY:22265}  Affect:  {BHH AFFECT:22266}  Cognitive:  {BHH COGNITIVE:22267}  Insight: {BHH Insight2:20797}  Engagement in Group:  {BHH ENGAGEMENT IN GROUP:22268}  Modes of Intervention:  {BHH MODES OF INTERVENTION:22269}  Additional Comments:  ***  Santiel Topper W Majestic Molony 12/09/2022, 1:46 PM  
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
## 2022-12-10 MED ORDER — BENZTROPINE MESYLATE 0.5 MG PO TABS
0.5000 mg | ORAL_TABLET | Freq: Every day | ORAL | Status: DC
  Administered 2022-12-10 – 2022-12-14 (×4): 0.5 mg via ORAL
  Filled 2022-12-10 (×10): qty 1

## 2022-12-10 MED ORDER — OLANZAPINE 10 MG IM SOLR: 5.0000 mg | Freq: Two times a day (BID) | INTRAMUSCULAR | Status: DC | PRN
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
## 2022-12-10 NOTE — Progress Notes (Signed)
Pt is A&OX4, calm, noncompliant with medications until forced-med order initiated & enforced, denies suicidal ideations, denies homicidal ideations, denies auditory hallucinations and denies visual hallucinations. Pt verbally agrees to approach staff if these become apparent and before harming self or others. Pt denies experiencing nightmares. Mood and affect are congruent. Pt appetite is ok. No complaints of anxiety, distress, pain and/or discomfort at this time. Pt's memory appears to be grossly intact, and Pt hasn't displayed any injurious behaviors. Pt is medication compliant. There's no evidence of suicidal intent. Psychomotor activity was WNL. No s/s of Parkinson, Dystonia, Akathisia and/or Tardive Dyskinesia noted.

## 2022-12-10 NOTE — Progress Notes (Signed)
Pt continues to pace hallway and attempt to antagonize staff.

## 2022-12-10 NOTE — Plan of Care (Signed)
  Problem: Coping: Goal: Ability to demonstrate self-control will improve Outcome: Progressing   Problem: Coping: Goal: Coping ability will improve Outcome: Progressing Goal: Will verbalize feelings Outcome: Progressing

## 2022-12-10 NOTE — Progress Notes (Signed)
    12/10/22 2200  Psych Admission Type (Psych Patients Only)  Admission Status Involuntary  Psychosocial Assessment  Patient Complaints None  Eye Contact Fair  Facial Expression Animated  Affect Preoccupied  Speech Logical/coherent  Interaction Assertive  Motor Activity Restless  Appearance/Hygiene Unremarkable  Behavior Characteristics Fidgety  Mood Pleasant  Thought Process  Coherency Circumstantial;Loose associations  Content Preoccupation  Delusions Paranoid  Perception Hallucinations  Hallucination None reported or observed  Judgment Impaired  Confusion None  Danger to Self  Current suicidal ideation? Denies  Danger to Others  Danger to Others None reported or observed
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
## 2022-12-10 NOTE — Progress Notes (Signed)
Writer tried to explain to pt that the doctor put the forced medication in for pt and he was going to have to take the medication PO or IM. Pt informed that he would have to start taking his medications at 8 am this morning. Writer explained that this information was explained to pt numerous times, pt acted like he never heard the information before.

## 2022-12-10 NOTE — Group Note (Signed)
Date:  12/10/2022 Time:  8:21 PM  Group Topic/Focus:  Wrap-Up Group:   The focus of this group is to help patients review their daily goal of treatment and discuss progress on daily workbooks.    Participation Level:  Active  Participation Quality:  Appropriate  Affect:  Appropriate  Cognitive:  Appropriate  Insight: Appropriate  Engagement in Group:  Engaged  Modes of Intervention:  Education and Exploration  Additional Comments:  Patient attended and participated in group tonight. He reports that the significant thing that happen for him today was that today he took his medication.  Lita [facility] 12/10/2022, 8:21 PM

## 2022-12-11 LAB — CERULOPLASMIN: Ceruloplasmin: 20.4 mg/dL (ref 16.0–31.0)

## 2022-12-11 NOTE — Group Note (Signed)
Date:  12/11/2022 Time:  9:34 PM  Group Topic/Focus:  Wrap-Up Group:   The focus of this group is to help patients review their daily goal of treatment and discuss progress on daily workbooks.    Participation Level:  Active  Participation Quality:  Appropriate  Affect:  Appropriate  Cognitive:  Appropriate  Insight: Appropriate  Engagement in Group:  Engaged  Modes of Intervention:  Education and Exploration  Additional Comments:  Patient attended and participated in group tonight. He reports that today he came of unit restriction. He went to the gym with group and played basketball. He talked with a friend on the phone and his brother visited.  Lita Mains [facility] 12/11/2022, 9:34 PM

## 2022-12-11 NOTE — Progress Notes (Signed)
   12/11/22 2030  Psych Admission Type (Psych Patients Only)  Admission Status Involuntary  Psychosocial Assessment  Patient Complaints None  Eye Contact Fair  Facial Expression Animated  Affect Preoccupied  Speech Logical/coherent  Interaction Assertive  Motor Activity Restless  Appearance/Hygiene Unremarkable  Behavior Characteristics Fidgety  Mood Preoccupied;Suspicious  Aggressive Behavior  Effect No apparent injury  Thought Process  Coherency Circumstantial;Loose associations  Content Preoccupation  Delusions Paranoid  Perception WDL  Hallucination None reported or observed  Judgment Impaired  Confusion None  Danger to Self  Current suicidal ideation? Denies

## 2022-12-11 NOTE — Progress Notes (Signed)
   12/11/22 1610  15 Minute Checks  Location Bedroom  Visual Appearance Calm  Behavior Composed  Sleep (Behavioral Health Patients Only)  Calculate sleep? (Click Yes once per 24 hr at 0600 safety check) Yes  Documented sleep last 24 hours 8.5
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
## 2022-12-11 NOTE — [facility] Group Notes (Signed)
Adult Psychoeducational Group Note  Date:  12/11/2022 Time:  12:02 PM  Group Topic/Focus:  Goals Group:   The focus of this group is to help patients establish daily goals to achieve during treatment and discuss how the patient can incorporate goal setting into their daily lives to aide in recovery. Orientation:   The focus of this group is to educate the patient on the purpose and policies of crisis stabilization and provide a format to answer questions about their admission.  The group details unit policies and expectations of patients while admitted.  Participation Level:  Did Not Attend  Participation Quality:    Affect:    Cognitive:    Insight:   Engagement in Group:    Modes of Intervention:    Additional Comments:    Sheran Lawless 12/11/2022, 12:02 PM

## 2022-12-11 NOTE — Progress Notes (Signed)
   12/11/22 1200  Psych Admission Type (Psych Patients Only)  Admission Status Involuntary  Psychosocial Assessment  Patient Complaints None  Eye Contact Fair  Facial Expression Animated  Affect Preoccupied  Speech Logical/coherent  Interaction Assertive  Motor Activity Restless  Appearance/Hygiene Unremarkable  Behavior Characteristics Fidgety  Mood Pleasant  Thought Process  Coherency Circumstantial;Loose associations  Content Preoccupation  Delusions Paranoid  Perception WDL  Hallucination None reported or observed  Judgment Impaired  Confusion None  Danger to Self  Current suicidal ideation? Denies  Danger to Others  Danger to Others None reported or observed

## 2022-12-12 LAB — ANA W/REFLEX IF POSITIVE: Anti Nuclear Antibody (ANA): NEGATIVE

## 2022-12-12 MED ORDER — OLANZAPINE 10 MG IM SOLR: 5.0000 mg | Freq: Three times a day (TID) | INTRAMUSCULAR | Status: DC | PRN

## 2022-12-12 MED ORDER — OLANZAPINE 5 MG PO TABS: 5.0000 mg | ORAL_TABLET | Freq: Three times a day (TID) | ORAL | Status: DC | PRN

## 2022-12-12 NOTE — [facility] Group Notes (Signed)
Adult Psychoeducational Group Note  Date:  12/12/2022 Time:  9:10 PM  Group Topic/Focus:  Wrap-Up Group:   The focus of this group is to help patients review their daily goal of treatment and discuss progress on daily workbooks.  Participation Level:  Active  Participation Quality:  Appropriate  Affect:  Appropriate  Cognitive:  Appropriate  Insight: Appropriate  Engagement in Group:  Engaged  Modes of Intervention:  Discussion and Support  Additional Comments:  Pt told that today was a good day on the unit, the highlight of which was enjoying an earlier group on meditation. On the subject of goals for this week, Pt mentioned being primarily focused on his physical wellness by exercising in his room. He rated his day an 8 out of 10.  Mason Sanchez 12/12/2022, 9:10 PM
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
## 2022-12-12 NOTE — Group Note (Signed)
Recreation Therapy Group Note   Group Topic:Stress Management  Group Date: 12/12/2022 Start Time: 1010 End Time: 1048 Facilitators: Jaleya Pebley-McCall, LRT,CTRS Location: [address] Dayroom   Goal Area(s) Addresses:  Patient will identify positive stress management techniques. Patient will identify benefits of using stress management post d/c.  Group Description: Deep Breathing, Meditation.  LRT and patients discussed some of the ways in which each of them relieves their stress. LRT and patients then went through the exercise of deep breathing. LRT explained to patients the importance of using deep breathing to help calm anxiety, stress, heart rate, etc. LRT then played a meditation for the patients that focused on not holding ones self back and envisioning what they wanted for their future selves and how to get there. Patients were to sit relaxed, with eyes closed and focus as much as possible on the meditation as it played.    Affect/Mood: Appropriate   Participation Level: Engaged   Participation Quality: Independent   Behavior: Appropriate   Speech/Thought Process: Focused   Insight: Good   Judgement: Good   Modes of Intervention: Breathing, Meditation   Patient Response to Interventions:  Engaged   Education Outcome:  Acknowledges education   Clinical Observations/Individualized Feedback: Pt was able to be calm and appropriate during group session. Pt did have some random laughing but not as much. Pt expressed he deals with stress through "exercise/going for a run". Pt also shared he sees himself having family one day. Pt stated he could reach that version of himself by "waiting for the right person and not rush it".     Plan: Continue to engage patient in RT group sessions 2-3x/week.   Trafton Roker-McCall, LRT,CTRS 12/12/2022 12:39 PM

## 2022-12-12 NOTE — Progress Notes (Signed)
   12/12/22 2000  Psych Admission Type (Psych Patients Only)  Admission Status Involuntary  Psychosocial Assessment  Patient Complaints None  Eye Contact Fair  Facial Expression Animated  Affect Preoccupied  Speech Logical/coherent  Interaction Assertive  Motor Activity Restless  Appearance/Hygiene Unremarkable  Behavior Characteristics Cooperative  Mood Anxious;Pleasant  Aggressive Behavior  Effect No apparent injury  Thought Process  Coherency Circumstantial;Loose associations  Content Preoccupation  Delusions Paranoid  Perception WDL  Hallucination None reported or observed  Judgment Impaired  Confusion None  Danger to Self  Current suicidal ideation? Denies

## 2022-12-12 NOTE — Progress Notes (Signed)
Pt A & O X 3. Denies SI, HI, AVH and pain, none observed when assessed. Noted to be animated with bright affect, logical speech, fair eye contact. Interacts well with others. Attended and participated in scheduled groups with good insight. Went off unit for meals, recreation time in courtyard as prompted. Pt remains medication compliant, denies adverse drug reactions when assessed. Support, encouragement and reassurance offered. Safety checks maintained at Q 15 minutes intervals. Pt tolerated meal and fluids well. Safety maintained.
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
## 2022-12-13 NOTE — B[facility] Group Notes (Signed)
Adult Psychoeducational Group Note  Date:  12/13/2022 Time:  8:47 PM  Group Topic/Focus:  Wrap-Up Group:   The focus of this group is to help patients review their daily goal of treatment and discuss progress on daily workbooks.  Participation Level:  Active  Participation Quality:  Attentive  Affect:  Appropriate  Cognitive:  Appropriate  Insight: Appropriate  Engagement in Group:  Engaged  Modes of Intervention:  Discussion  Additional Comments:  Patient attended and participated in the Wrap-up group.  Jearl Klinefelter 12/13/2022, 8:47 PM

## 2022-12-13 NOTE — BHH Group Notes (Signed)
Type of Therapy:  Group Topic/ Focus: Goals Group: The focus of this group is to help patients establish daily goals to achieve during treatment and discuss how the patient can incorporate goal setting into their daily lives to aide in recovery.    Participation Level:  Active   Participation Quality:  Appropriate   Affect:  Appropriate   Cognitive:  Appropriate   Insight:  Appropriate   Engagement in Group:  Engaged   Modes of Intervention:  Discussion   Summary of Progress/Problems:   Patient attended and participated goals group today. No SI/HI. Patient's goal for today is to have a peace of mind

## 2022-12-13 NOTE — Group Note (Signed)
B[facility] LCSW Group Therapy Note   Group Date: 12/13/2022 Start Time: 1100 End Time: 1200   Type of Therapy and Topic: Group Therapy: Avoiding Self-Sabotaging and Enabling Behaviors  Participation Level: Did Not Attend  Mood:  Description of Group:  In this group, patients will learn how to identify obstacles, self-sabotaging and enabling behaviors, as well as: what are they, why do we do them and what needs these behaviors meet. Discuss unhealthy relationships and how to have positive healthy boundaries with those that sabotage and enable. Explore aspects of self-sabotage and enabling in yourself and how to limit these self-destructive behaviors in everyday life.   Therapeutic Goals: 1. Patient will identify one obstacle that relates to self-sabotage and enabling behaviors 2. Patient will identify one personal self-sabotaging or enabling behavior they did prior to admission 3. Patient will state a plan to change the above identified behavior 4. Patient will demonstrate ability to communicate their needs through discussion and/or role play.    Summary of Patient Progress:   none   Therapeutic Modalities:  Cognitive Behavioral Therapy Person-Centered Therapy Motivational Interviewing    Olanna Percifield M Coye Dawood, LCSW 

## 2022-12-13 NOTE — Progress Notes (Signed)
   12/13/22 1016  Psych Admission Type (Psych Patients Only)  Admission Status Involuntary  Psychosocial Assessment  Patient Complaints Other (Comment)  Eye Contact Fair  Facial Expression Animated  Affect Appropriate to circumstance  Speech Logical/coherent  Interaction Assertive  Motor Activity Restless  Appearance/Hygiene Unremarkable  Behavior Characteristics Cooperative;Appropriate to situation  Mood Pleasant;Anxious  Thought Process  Coherency WDL  Content WDL  Delusions None reported or observed  Perception WDL  Hallucination None reported or observed  Judgment Impaired  Confusion None  Danger to Self  Current suicidal ideation? Denies  Danger to Others  Danger to Others None reported or observed

## 2022-12-13 NOTE — Group Note (Signed)
Recreation Therapy Group Note   Group Topic:Health and Wellness  Group Date: 12/13/2022 Start Time: 1040 End Time: 1110 Facilitators: Dondrea Clendenin-McCall, LRT,CTRS Location: [address] Dayroom   Goal Area(s) Addresses:  Patient will define components of whole wellness. Patient will verbalize benefit of whole wellness.  Group Description:  Exercise. LRT and patients discussed the importance of physical wellbeing. LRT discussed with patients the importance of having the three elements of mental, physical and spiritual working together for [employer] [place] health and wellbeing. LRT explained to patients they would be completing 30 minutes of exercise. Patients took turns leading the group in the exercises of their choosing. Patients were to take breaks or get water if needed.    Affect/Mood: Appropriate   Participation Level: Engaged   Participation Quality: Independent   Behavior: Appropriate   Speech/Thought Process: Focused   Insight: Good   Judgement: Good   Modes of Intervention: Music and Exercise   Patient Response to Interventions:  Engaged   Education Outcome:  Acknowledges education   Clinical Observations/Individualized Feedback: Pt was focused and appropriate. Pt led group in stretching before doing the actual exercises. Pt was bright and engaged with peers. Pt led group in hip stretches, arm stretches and twists.     Plan: Continue to engage patient in RT group sessions 2-3x/week.   Hawk Mones-McCall, LRT,CTRS 12/13/2022 1:31 PM

## 2022-12-14 MED ORDER — HYDROXYZINE HCL 25 MG PO TABS: 25.0000 mg | ORAL_TABLET | Freq: Three times a day (TID) | ORAL | 0 refills | Status: AC | PRN

## 2022-12-14 MED ORDER — RISPERIDONE 1 MG PO TABS: 1.0000 mg | ORAL_TABLET | Freq: Two times a day (BID) | ORAL | 0 refills | Status: AC

## 2022-12-14 MED ORDER — BENZTROPINE MESYLATE 0.5 MG PO TABS: 0.5000 mg | ORAL_TABLET | Freq: Every day | ORAL | 0 refills | Status: AC

## 2022-12-14 MED ORDER — TRAZODONE HCL 50 MG PO TABS: 50.0000 mg | ORAL_TABLET | Freq: Every evening | ORAL | 0 refills | Status: AC | PRN

## 2022-12-14 NOTE — Progress Notes (Addendum)
Patient discharged from [facility] on 12/14/22 at 1130. Patient denies SI, plan, and intention. Suicide safety plan completed, reviewed with this RN, given to the patient, and a copy in the chart. Patient denies HI/AVH upon discharge. Patient is alert, oriented, and cooperative. RN provided patient with discharge paperwork and reviewed information with patient. Patient expressed that he understood all of the discharge instructions. Pt was satisfied with belongings returned to him from the locker and at bedside. Discharged patient to [facility] waiting room. Pt's parents awaiting patient in the [facility] waiting room.

## 2022-12-14 NOTE — [facility] Suicide Risk Assessment (Signed)
[facility] INPATIENT:  Family/Significant Other Suicide Prevention Education  Suicide Prevention Education:  Education Completed; Haseeb Fiallos [phone],  (name of family member/significant other) has been identified by the patient as the family member/significant other with whom the patient will be residing, and identified as the person(s) who will aid the patient in the event of a mental health crisis (suicidal ideations/suicide attempt).  With written consent from the patient, the family member/significant other has been provided the following suicide prevention education, prior to the and/or following the discharge of the patient.  CSW spoke with patient father and completed safety planning. Father shared that he feels as if patient is being released too soon based on his conversation with him a few days ago. Father shared that patient said that he was going to stay in his room and not take his medication once he get home. CSW shared that PHP referral was made on patient behalf although patient declined follow up appointments at this time . Dad requested for a provider to give him a call, providers made aware. Dad mentioned that he tired to take patient to [facility] for his medication but it was a long wait . Also Dad shared that pt stated that he purchased a switch knife but parents cannot find it or when dad asked patient about it he said that he lost it. No guns in home but their are utensils in home.   The suicide prevention education provided includes the following: Suicide risk factors Suicide prevention and interventions National Suicide Hotline telephone number [facility] assessment telephone number [facility] Emergency Assistance 911 [facility] and/or Residential Mobile Crisis Unit telephone number  Request made of family/significant other to: Remove weapons (e.g., guns, rifles, knives), all items previously/currently identified as safety concern.   Remove  drugs/medications (over-the-counter, prescriptions, illicit drugs), all items previously/currently identified as a safety concern.  The family member/significant other verbalizes understanding of the suicide prevention education information provided.  The family member/significant other agrees to remove the items of safety concern listed above.  Isabella Bowens 12/14/2022, 10:00 AM

## 2022-12-15 ENCOUNTER — Ambulatory Visit (HOSPITAL_COMMUNITY): Payer: No Typology Code available for payment source

## 2022-12-15 ENCOUNTER — Telehealth (HOSPITAL_COMMUNITY): Payer: Self-pay | Admitting: Professional

## 2022-12-15 ENCOUNTER — Encounter (HOSPITAL_COMMUNITY): Payer: Self-pay

## 2023-01-25 ENCOUNTER — Telehealth (HOSPITAL_COMMUNITY): Payer: Self-pay | Admitting: Licensed Clinical Social Worker

## 2023-01-26 ENCOUNTER — Ambulatory Visit (INDEPENDENT_AMBULATORY_CARE_PROVIDER_SITE_OTHER): Payer: Medicaid Other | Admitting: Licensed Clinical Social Worker

## 2023-01-26 DIAGNOSIS — F250 Schizoaffective disorder, bipolar type: Secondary | ICD-10-CM
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
## 2023-02-18 NOTE — Progress Notes (Deleted)
Psychiatric Initial Adult Assessment  Patient Identification: Mason Sanchez MRN:  [id number] Date of Evaluation:  02/18/2023 Referral Source: ***  Assessment:  Mason Sanchez is a 32 y.o. male with a history of schizoaffective disorder, bipolar type, alcohol use disorder, cannabis use disorder who presents in person to [facility] for medication management.  Patient reports ***  Plan:  # *** Past medication trials:  Status of problem: *** Interventions: -- ***  # *** Past medication trials:  Status of problem: *** Interventions: -- ***  # *** Past medication trials:  Status of problem: *** Interventions: -- ***  Return to care in ***  Patient was given contact information for behavioral health clinic and was instructed to call 911 for emergencies.    Patient and plan of care will be discussed with the Attending MD, Dr. ***, who agrees with the above statement and plan.   Subjective:  Chief Complaint: Medication Management  History of Present Illness:  ***  Past Psychiatric History:  Diagnoses: *** Medication trials: *** Previous psychiatrist/therapist: *** Hospitalizations: *** Suicide attempts: *** SIB: *** Hx of violence towards others: *** Current access to guns: *** Hx of trauma/abuse: ***  Substance Abuse History in the last 12 months:  {yes no:314532}  Past Medical History:  Past Medical History:  Diagnosis Date   Alcoholism /alcohol abuse 07/09/2015   [facility] admission 07/2015 for alcoholism and marijuana by report; discharged on Naltrexone?   Asthma    childhood asthma; no hospitalizations   History of obsessive compulsive disorder    Nasal fracture    Polysubstance abuse [facility])    [facility] admission 07/2015    Past Surgical History:  Procedure Laterality Date   FRACTURE SURGERY     nasal fracture    Family Psychiatric History: ***  Family History:  Family History  Problem Relation Age of Onset    Hypertension Father    Diabetes Maternal Grandmother    Hypertension Maternal Grandfather    Heart disease Paternal Grandfather    Drug abuse Paternal Grandfather    Alcohol abuse Maternal Uncle    Alcohol abuse Cousin     Social History:   Academic/Vocational: *** Social History   Socioeconomic History   Marital status: Single    Spouse name: Not on file   Number of children: Not on file   Years of education: Not on file   Highest education level: Associate degree: occupational, Scientist, product/process development, or vocational program  Occupational History   Occupation: Advertising copywriter  Tobacco Use   Smoking status: Never    Passive exposure: Past   Smokeless tobacco: Never  Vaping Use   Vaping status: Never Used  Substance and Sexual Activity   Alcohol use: Not Currently    Alcohol/week: 0.0 standard drinks of alcohol    Comment: histoyr of alcoholism; [facility] 07/2015   Drug use: Not Currently    Types: Marijuana, Benzodiazepines    Comment: history of marijuana use   Sexual activity: Not Currently  Other Topics Concern   Not on file  Social History Narrative   Marital status: single      Children:  None      Lives: with parents      Employment: residential counselor at [employer]      Tobacco: none      Alcohol: none in 10/2015; [facility] 07/2015 for alcoholism      Drugs: none in 10/2015; [facility] in 07/2015 for marijuana? Per patient; discharged on Naltrexone?  Exercise: sporadically; 2-3 days per week      Seatbelt: 100%; no texting      06/05/2019 patient has apparently completed [facility] after attending 1-1/2 years to [facility] after [employer].  Cannabis more than alcohol than Xanax have been substances of abuse in the past.  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   Social Determinants of Health   Financial Resource Strain: Not on file  Food Insecurity: No Food Insecurity (12/06/2022)    Hunger Vital Sign    Worried About Running Out of Food in the Last Year: Never true    Ran Out of Food in the Last Year: Never true  Transportation Needs: No Transportation Needs (12/06/2022)   PRAPARE - Administrator, Civil Service (Medical): No    Lack of Transportation (Non-Medical): No  Physical Activity: Not on file  Stress: Not on file  Social Connections: Not on file    Additional Social History: updated  Allergies:  No Known Allergies  Current Medications: Current Outpatient Medications  Medication Sig Dispense Refill   benztropine (COGENTIN) 0.5 MG tablet Take 1 tablet (0.5 mg total) by mouth daily. 30 tablet 0   hydrOXYzine (ATARAX) 25 MG tablet Take 1 tablet (25 mg total) by mouth 3 (three) times daily as needed for anxiety. 30 tablet 0   risperiDONE (RISPERDAL) 1 MG tablet Take 1 tablet (1 mg total) by mouth 2 (two) times daily. 30 tablet 0   traZODone (DESYREL) 50 MG tablet Take 1 tablet (50 mg total) by mouth at bedtime as needed for sleep. 30 tablet 0   No current facility-administered medications for this visit.    ROS: Review of Systems ***  Objective:  Psychiatric Specialty Exam: There were no vitals taken for this visit.There is no height or weight on file to calculate BMI.  General Appearance: {Appearance:22683}  Eye Contact:  {BHH EYE CONTACT:22684}  Speech:  {Speech:22685}  Volume:  {Volume (PAA):22686}  Mood:  {BHH MOOD:22306}  Affect:  {Affect (PAA):22687}  Thought Content: {Thought Content:22690}   Suicidal Thoughts:  {ST/HT (PAA):22692}  Homicidal Thoughts:  {ST/HT (PAA):22692}  Thought Process:  {Thought Process (PAA):22688}  Orientation:  {BHH ORIENTATION (PAA):22689}    Memory: {BHH MEMORY:22881}  Judgment:  {Judgement (PAA):22694}  Insight:  {Insight (PAA):22695}  Concentration:  {Concentration:21399}  Recall:  not formally assessed ***  Fund of Knowledge: {BHH GOOD/FAIR/POOR:22877}  Language: {BHH GOOD/FAIR/POOR:22877}   Psychomotor Activity:  {Psychomotor (PAA):22696}  Akathisia:  {BHH YES OR NO:22294}  AIMS (if indicated): {Desc; done/not:10129}  Assets:  {Assets (PAA):22698}  ADL's:  {BHH ZOX'W:96045}  Cognition: {chl bhh cognition:304700322}  Sleep:  {BHH GOOD/FAIR/POOR:22877}   PE: General: well-appearing; no acute distress *** Pulm: no increased work of breathing on room air *** Strength & Muscle Tone: {desc; muscle tone:32375} Neuro: no focal neurological deficits observed *** Gait & Station: {PE GAIT ED WUJW:11914}  Metabolic Disorder Labs: Lab Results  Component Value Date   HGBA1C 4.8 12/09/2022   MPG 91.06 12/09/2022   No results found for: "PROLACTIN" Lab Results  Component Value Date   CHOL 129 12/09/2022   TRIG 42 12/09/2022   HDL 44 12/09/2022   CHOLHDL 2.9 12/09/2022   VLDL 8 12/09/2022   LDLCALC 77 12/09/2022   Lab Results  Component Value Date   TSH 2.194 12/09/2022    Therapeutic Level Labs: No results found for: "LITHIUM" No results found for: "CBMZ" No results found  for: "VALPROATE"  Screenings:  AUDIT    Flowsheet Row [facility]  Alcohol Use Disorder Identification Test Final Score (AUDIT) 1      GAD-7    Flowsheet Row Counselor from 01/26/2023 in [facility]  Total GAD-7 Score 6      PHQ2-9    Flowsheet Row Counselor from 01/26/2023 in [facility] Office Visit from 10/29/2015 in [facility] at [facility] Total Score 1 0      Flowsheet Row Counselor from 01/26/2023 in [facility] Admission (Discharged) [facility] ED from 12/04/2022 in [facility] at [facility]  C-SSRS RISK CATEGORY Error: Question 2 not populated No Risk No Risk       Collaboration of Care: Collaboration of Care: [facility] OP Collaboration  of Care:[id number]}  Patient/Guardian was advised Release of Information must be obtained prior to any record release in order to collaborate their care with an outside provider. Patient/Guardian was advised if they have not already done so to contact the registration department to sign all necessary forms in order for [place] to release information regarding their care.   Consent: Patient/Guardian gives verbal consent for treatment and assignment of benefits for services provided during this visit. Patient/Guardian expressed understanding and agreed to proceed.   Park Pope, MD 9/14/20243:47 PM

## 2023-02-23 ENCOUNTER — Ambulatory Visit (HOSPITAL_COMMUNITY): Payer: No Typology Code available for payment source | Admitting: Student

## 2023-07-04 ENCOUNTER — Other Ambulatory Visit (HOSPITAL_BASED_OUTPATIENT_CLINIC_OR_DEPARTMENT_OTHER): Payer: Self-pay

## 2023-07-04 ENCOUNTER — Emergency Department (HOSPITAL_BASED_OUTPATIENT_CLINIC_OR_DEPARTMENT_OTHER)
Admission: EM | Admit: 2023-07-04 | Discharge: 2023-07-04 | Disposition: A | Discharge status: Home / Self Care | Payer: MEDICAID | Attending: Emergency Medicine | Admitting: Emergency Medicine

## 2023-07-04 ENCOUNTER — Other Ambulatory Visit: Payer: Self-pay

## 2023-07-04 ENCOUNTER — Encounter (HOSPITAL_BASED_OUTPATIENT_CLINIC_OR_DEPARTMENT_OTHER): Payer: Self-pay

## 2023-07-04 DIAGNOSIS — W540XXA Bitten by dog, initial encounter: Secondary | ICD-10-CM | POA: Insufficient documentation

## 2023-07-04 DIAGNOSIS — S51812A Laceration without foreign body of left forearm, initial encounter: Secondary | ICD-10-CM | POA: Insufficient documentation

## 2023-07-04 DIAGNOSIS — Z23 Encounter for immunization: Secondary | ICD-10-CM | POA: Insufficient documentation

## 2023-07-04 MED ORDER — TETANUS-DIPHTH-ACELL PERTUSSIS 5-2.5-18.5 LF-MCG/0.5 IM SUSY
0.5000 mL | PREFILLED_SYRINGE | Freq: Once | INTRAMUSCULAR | Status: AC
  Administered 2023-07-04: 0.5 mL via INTRAMUSCULAR
  Filled 2023-07-04: qty 0.5

## 2023-07-04 MED ORDER — AMOXICILLIN-POT CLAVULANATE 875-125 MG PO TABS
1.0000 | ORAL_TABLET | Freq: Once | ORAL | Status: AC
  Administered 2023-07-04: 1 via ORAL
  Filled 2023-07-04: qty 1

## 2023-07-04 MED ORDER — AMOXICILLIN-POT CLAVULANATE 875-125 MG PO TABS
1.0000 | ORAL_TABLET | Freq: Two times a day (BID) | ORAL | 0 refills | Status: AC
  Filled 2023-07-04: qty 14, 7d supply, fill #0

## 2023-07-04 NOTE — ED Provider Notes (Signed)
[place] [facility]MENT AT [facility] Provider Note   CSN: [id number] Arrival date & time: 07/04/23  1131     History  Chief Complaint  Patient presents with   Animal Bite    Mason Sanchez is a 33 y.o. male.  33 yo M with a chief complaints of an injury to the left forearm.  The patient was at work and was attacked by a dog.  Bit to the left forearm.  Denies other injury.  Unsure of last tetanus.  Domesticated Management consultant      Home Medications Prior to Admission medications   Medication Sig Start Date End Date Taking? Authorizing Provider  amoxicillin-clavulanate (AUGMENTIN) 875-125 MG tablet Take 1 tablet by mouth every 12 (twelve) hours. 07/04/23  Yes Mason Plan, DO  benztropine (COGENTIN) 0.5 MG tablet Take 1 tablet (0.5 mg total) by mouth daily. 12/15/22   Ntuen, Jesusita Oka, FNP  hydrOXYzine (ATARAX) 25 MG tablet Take 1 tablet (25 mg total) by mouth 3 (three) times daily as needed for anxiety. 12/14/22   Ntuen, Jesusita Oka, FNP  risperiDONE (RISPERDAL) 1 MG tablet Take 1 tablet (1 mg total) by mouth 2 (two) times daily. 12/14/22   Ntuen, Jesusita Oka, FNP  traZODone (DESYREL) 50 MG tablet Take 1 tablet (50 mg total) by mouth at bedtime as needed for sleep. 12/14/22   Cecilie Lowers, FNP      Allergies    Patient has no known allergies.    Review of Systems   Review of Systems  Physical Exam Updated Vital Signs BP (!) 147/87 (BP Location: Right Arm)   Pulse (!) 47   Temp 98.7 F (37.1 C)   Resp 17   Ht (!) 6" (0.152 m)   Wt 90.7 kg   SpO2 97%   BMI 3905.98 kg/m  Physical Exam Vitals and nursing note reviewed.  Constitutional:      Appearance: He is well-developed.  HENT:     Head: Normocephalic and atraumatic.  Eyes:     Pupils: Pupils are equal, round, and reactive to light.  Neck:     Vascular: No JVD.  Cardiovascular:     Rate and Rhythm: Normal rate and regular rhythm.     Heart sounds: No murmur heard.    No friction rub. No gallop.   Pulmonary:     Effort: No respiratory distress.     Breath sounds: No wheezing.  Abdominal:     General: There is no distension.     Tenderness: There is no abdominal tenderness. There is no guarding or rebound.  Musculoskeletal:        General: Normal range of motion.     Cervical back: Normal range of motion and neck supple.     Comments: A series of small lacerations to the left forearm just through the skin.  No obvious gaping  Skin:    Coloration: Skin is not pale.     Findings: No rash.  Neurological:     Mental Status: He is alert and oriented to person, place, and time.  Psychiatric:        Behavior: Behavior normal.     ED Results / Procedures / Treatments   Labs (all labs ordered are listed, but only abnormal results are displayed) Labs Reviewed - No data to display  EKG None  Radiology No results found.  Procedures Procedures    Medications Ordered in ED Medications  Tdap (BOOSTRIX) injection 0.5 mL (has no  administration in time range)  amoxicillin-clavulanate (AUGMENTIN) 875-125 MG per tablet 1 tablet (has no administration in time range)    ED Course/ Medical Decision Making/ A&P                                 Medical Decision Making Risk Prescription drug management.   33 yo M with a chief complaint of a dog bite to the left forearm.  Nothing that would benefit from suturing.  Will start on oral antibiotic prophylaxis.  Updated tetanus after his records were reviewed and I do not see a history of Tdap.  Domesticated animal under custody will hold off vaccination series for rabies.  1:47 PM:  I have discussed the diagnosis/risks/treatment options with the patient.  Evaluation and diagnostic testing in the emergency department does not suggest an emergent condition requiring admission or immediate intervention beyond what has been performed at this time.  They will follow up with PCP. We also discussed returning to the ED immediately if new or  worsening sx occur. We discussed the sx which are most concerning (e.g., sudden worsening pain, fever, inability to tolerate by mouth, redness, drainage) that necessitate immediate return. Medications administered to the patient during their visit and any new prescriptions provided to the patient are listed below.  Medications given during this visit Medications  Tdap (BOOSTRIX) injection 0.5 mL (has no administration in time range)  amoxicillin-clavulanate (AUGMENTIN) 875-125 MG per tablet 1 tablet (has no administration in time range)     The patient appears reasonably screen and/or stabilized for discharge and I doubt any other medical condition or other [facility] requiring further screening, evaluation, or treatment in the ED at this time prior to discharge.          Final Clinical Impression(s) / ED Diagnoses Final diagnoses:  Injury caused by dog bite, initial encounter    Rx / DC Orders ED Discharge Orders          Ordered    amoxicillin-clavulanate (AUGMENTIN) 875-125 MG tablet  Every 12 hours        07/04/23 1343              Mason Plan, DO 07/04/23 1347

## 2023-07-04 NOTE — ED Triage Notes (Signed)
In for eval of dog bite to left forearm while at work. Superficial lacs with have drawn blood. Owners report dog is vaccinated but he was unable to get any records.

## 2023-07-04 NOTE — Discharge Instructions (Signed)
Injuries caused by dogs could have a higher rate of infection and significantly we started you on antibiotics to prevent that.  Please return for redness drainage or if you develop a fever.  I have updated your tetanus shot here.  We do not typically start the rabies vaccine series unless the animal in question was wild or if they end up dying.  If this animal ends up dying then the animal control people should contact you to get your vaccination series.
